# Patient Record
Sex: Female | Born: 1960
Health system: Southern US, Community
[De-identification: ages and names within clinical notes are randomized; demographics above are authoritative.]

## PROBLEM LIST (undated history)

## (undated) DIAGNOSIS — M7552 Bursitis of left shoulder: Secondary | ICD-10-CM

## (undated) DIAGNOSIS — M797 Fibromyalgia: Secondary | ICD-10-CM

## (undated) DIAGNOSIS — H9190 Unspecified hearing loss, unspecified ear: Secondary | ICD-10-CM

## (undated) DIAGNOSIS — M940 Chondrocostal junction syndrome [Tietze]: Secondary | ICD-10-CM

## (undated) DIAGNOSIS — K219 Gastro-esophageal reflux disease without esophagitis: Secondary | ICD-10-CM

## (undated) DIAGNOSIS — L719 Rosacea, unspecified: Secondary | ICD-10-CM

## (undated) DIAGNOSIS — C801 Malignant (primary) neoplasm, unspecified: Secondary | ICD-10-CM

## (undated) DIAGNOSIS — R079 Chest pain, unspecified: Secondary | ICD-10-CM

## (undated) DIAGNOSIS — E559 Vitamin D deficiency, unspecified: Secondary | ICD-10-CM

## (undated) DIAGNOSIS — M1990 Unspecified osteoarthritis, unspecified site: Secondary | ICD-10-CM

## (undated) DIAGNOSIS — K259 Gastric ulcer, unspecified as acute or chronic, without hemorrhage or perforation: Secondary | ICD-10-CM

## (undated) DIAGNOSIS — G43909 Migraine, unspecified, not intractable, without status migrainosus: Secondary | ICD-10-CM

## (undated) DIAGNOSIS — R51 Headache: Secondary | ICD-10-CM

## (undated) HISTORY — DX: Migraine, unspecified, not intractable, without status migrainosus: G43.909

## (undated) HISTORY — DX: Unspecified hearing loss, unspecified ear: H91.90

## (undated) HISTORY — DX: Vitamin D deficiency, unspecified: E55.9

## (undated) HISTORY — DX: Chest pain, unspecified: R07.9

## (undated) HISTORY — PX: DILATION AND CURETTAGE OF UTERUS: SHX78

## (undated) HISTORY — PX: OTHER SURGICAL HISTORY: SHX169

---

## 1995-06-10 HISTORY — PX: OTHER SURGICAL HISTORY: SHX169

## 1998-12-10 ENCOUNTER — Other Ambulatory Visit: Admission: RE | Admit: 1998-12-10 | Discharge: 1998-12-10 | Payer: Self-pay | Admitting: Obstetrics and Gynecology

## 1999-02-04 ENCOUNTER — Other Ambulatory Visit: Admission: RE | Admit: 1999-02-04 | Discharge: 1999-02-04 | Payer: Self-pay | Admitting: Obstetrics and Gynecology

## 1999-02-04 ENCOUNTER — Encounter (INDEPENDENT_AMBULATORY_CARE_PROVIDER_SITE_OTHER): Payer: Self-pay | Admitting: Specialist

## 2001-01-18 ENCOUNTER — Ambulatory Visit (HOSPITAL_COMMUNITY): Admission: RE | Admit: 2001-01-18 | Discharge: 2001-01-18 | Payer: Self-pay | Admitting: Obstetrics and Gynecology

## 2001-01-18 ENCOUNTER — Encounter: Payer: Self-pay | Admitting: Obstetrics and Gynecology

## 2001-04-29 ENCOUNTER — Other Ambulatory Visit: Admission: RE | Admit: 2001-04-29 | Discharge: 2001-04-29 | Payer: Self-pay | Admitting: Obstetrics and Gynecology

## 2001-05-07 ENCOUNTER — Encounter: Payer: Self-pay | Admitting: Obstetrics and Gynecology

## 2001-05-07 ENCOUNTER — Ambulatory Visit (HOSPITAL_COMMUNITY): Admission: RE | Admit: 2001-05-07 | Discharge: 2001-05-07 | Payer: Self-pay | Admitting: Obstetrics and Gynecology

## 2003-12-19 ENCOUNTER — Encounter: Admission: RE | Admit: 2003-12-19 | Discharge: 2003-12-19 | Payer: Self-pay | Admitting: Obstetrics and Gynecology

## 2003-12-26 ENCOUNTER — Other Ambulatory Visit: Admission: RE | Admit: 2003-12-26 | Discharge: 2003-12-26 | Payer: Self-pay | Admitting: Obstetrics and Gynecology

## 2005-03-03 ENCOUNTER — Other Ambulatory Visit: Admission: RE | Admit: 2005-03-03 | Discharge: 2005-03-03 | Payer: Self-pay | Admitting: Obstetrics and Gynecology

## 2005-03-17 ENCOUNTER — Encounter: Admission: RE | Admit: 2005-03-17 | Discharge: 2005-03-17 | Payer: Self-pay | Admitting: Neurology

## 2005-04-11 ENCOUNTER — Encounter: Admission: RE | Admit: 2005-04-11 | Discharge: 2005-04-11 | Payer: Self-pay | Admitting: Obstetrics and Gynecology

## 2005-06-09 HISTORY — PX: CYSTOCELE REPAIR: SHX163

## 2005-06-09 HISTORY — PX: RECTOCELE REPAIR: SHX761

## 2005-12-05 ENCOUNTER — Other Ambulatory Visit: Admission: RE | Admit: 2005-12-05 | Discharge: 2005-12-05 | Payer: Self-pay | Admitting: Obstetrics and Gynecology

## 2006-05-06 ENCOUNTER — Other Ambulatory Visit: Admission: RE | Admit: 2006-05-06 | Discharge: 2006-05-06 | Payer: Self-pay | Admitting: Obstetrics and Gynecology

## 2006-10-13 ENCOUNTER — Other Ambulatory Visit: Admission: RE | Admit: 2006-10-13 | Discharge: 2006-10-13 | Payer: Self-pay | Admitting: Obstetrics and Gynecology

## 2006-10-23 ENCOUNTER — Encounter: Admission: RE | Admit: 2006-10-23 | Discharge: 2006-10-23 | Payer: Self-pay | Admitting: Obstetrics and Gynecology

## 2006-10-29 ENCOUNTER — Encounter: Admission: RE | Admit: 2006-10-29 | Discharge: 2006-10-29 | Payer: Self-pay | Admitting: Sports Medicine

## 2006-10-30 ENCOUNTER — Encounter: Admission: RE | Admit: 2006-10-30 | Discharge: 2006-10-30 | Payer: Self-pay | Admitting: Obstetrics and Gynecology

## 2007-04-21 ENCOUNTER — Other Ambulatory Visit: Admission: RE | Admit: 2007-04-21 | Discharge: 2007-04-21 | Payer: Self-pay | Admitting: Obstetrics and Gynecology

## 2007-06-10 HISTORY — PX: ABDOMINAL HYSTERECTOMY: SHX81

## 2007-09-09 ENCOUNTER — Other Ambulatory Visit: Admission: RE | Admit: 2007-09-09 | Discharge: 2007-09-09 | Payer: Self-pay | Admitting: Obstetrics and Gynecology

## 2007-11-02 ENCOUNTER — Ambulatory Visit (HOSPITAL_COMMUNITY): Admission: RE | Admit: 2007-11-02 | Discharge: 2007-11-02 | Payer: Self-pay | Admitting: Obstetrics and Gynecology

## 2008-11-09 ENCOUNTER — Other Ambulatory Visit: Admission: RE | Admit: 2008-11-09 | Discharge: 2008-11-09 | Payer: Self-pay | Admitting: Obstetrics and Gynecology

## 2008-11-15 ENCOUNTER — Encounter: Admission: RE | Admit: 2008-11-15 | Discharge: 2008-11-15 | Payer: Self-pay | Admitting: Obstetrics and Gynecology

## 2009-12-31 ENCOUNTER — Observation Stay (HOSPITAL_COMMUNITY)
Admission: EM | Admit: 2009-12-31 | Discharge: 2009-12-31 | Payer: Self-pay | Source: Home / Self Care | Admitting: Emergency Medicine

## 2010-01-22 ENCOUNTER — Other Ambulatory Visit: Admission: RE | Admit: 2010-01-22 | Discharge: 2010-01-22 | Payer: Self-pay | Admitting: Obstetrics and Gynecology

## 2010-08-24 LAB — DIFFERENTIAL
Basophils Absolute: 0 10*3/uL (ref 0.0–0.1)
Basophils Relative: 0 % (ref 0–1)
Eosinophils Absolute: 0.3 10*3/uL (ref 0.0–0.7)
Eosinophils Relative: 4 % (ref 0–5)
Monocytes Absolute: 0.5 10*3/uL (ref 0.1–1.0)
Monocytes Relative: 8 % (ref 3–12)
Neutro Abs: 3.8 10*3/uL (ref 1.7–7.7)

## 2010-08-24 LAB — BASIC METABOLIC PANEL
BUN: 7 mg/dL (ref 6–23)
CO2: 27 mEq/L (ref 19–32)
GFR calc non Af Amer: >60 mL/min (ref >60)
Glucose, Bld: 102 mg/dL — ABNORMAL HIGH (ref 70–99)
Potassium: 3.7 mEq/L (ref 3.5–5.1)
Sodium: 139 mEq/L (ref 135–145)

## 2010-08-24 LAB — CBC
HCT: 40.2 % (ref 36.0–46.0)
Hemoglobin: 13.8 g/dL (ref 12.0–15.0)
MCH: 34.4 pg — ABNORMAL HIGH (ref 26.0–34.0)
MCHC: 34.4 g/dL (ref 30.0–36.0)
RDW: 12.4 % (ref 11.5–15.5)

## 2010-11-23 ENCOUNTER — Emergency Department (HOSPITAL_COMMUNITY): Payer: BC Managed Care – PPO

## 2010-11-23 ENCOUNTER — Emergency Department (HOSPITAL_COMMUNITY)
Admission: EM | Admit: 2010-11-23 | Discharge: 2010-11-23 | Disposition: A | Discharge status: Home / Self Care | Payer: BC Managed Care – PPO | Attending: Emergency Medicine | Admitting: Emergency Medicine

## 2010-11-23 DIAGNOSIS — W010XXA Fall on same level from slipping, tripping and stumbling without subsequent striking against object, initial encounter: Secondary | ICD-10-CM | POA: Insufficient documentation

## 2010-11-23 DIAGNOSIS — IMO0002 Reserved for concepts with insufficient information to code with codable children: Secondary | ICD-10-CM | POA: Insufficient documentation

## 2010-11-23 DIAGNOSIS — S8000XA Contusion of unspecified knee, initial encounter: Secondary | ICD-10-CM | POA: Insufficient documentation

## 2010-11-23 DIAGNOSIS — M25539 Pain in unspecified wrist: Secondary | ICD-10-CM | POA: Insufficient documentation

## 2011-02-13 ENCOUNTER — Other Ambulatory Visit: Payer: Self-pay | Admitting: Family Medicine

## 2011-02-13 DIAGNOSIS — Z1231 Encounter for screening mammogram for malignant neoplasm of breast: Secondary | ICD-10-CM

## 2011-03-14 ENCOUNTER — Ambulatory Visit
Admission: RE | Admit: 2011-03-14 | Discharge: 2011-03-14 | Disposition: A | Discharge status: Home / Self Care | Payer: BC Managed Care – PPO | Source: Ambulatory Visit | Attending: Family Medicine | Admitting: Family Medicine

## 2011-03-14 DIAGNOSIS — Z1231 Encounter for screening mammogram for malignant neoplasm of breast: Secondary | ICD-10-CM

## 2011-09-09 ENCOUNTER — Emergency Department (HOSPITAL_COMMUNITY): Payer: BC Managed Care – PPO

## 2011-09-09 ENCOUNTER — Emergency Department (HOSPITAL_COMMUNITY)
Admission: EM | Admit: 2011-09-09 | Discharge: 2011-09-09 | Disposition: A | Discharge status: Home / Self Care | Payer: BC Managed Care – PPO | Attending: Emergency Medicine | Admitting: Emergency Medicine

## 2011-09-09 DIAGNOSIS — S3210XA Unspecified fracture of sacrum, initial encounter for closed fracture: Secondary | ICD-10-CM | POA: Insufficient documentation

## 2011-09-09 DIAGNOSIS — S1093XA Contusion of unspecified part of neck, initial encounter: Secondary | ICD-10-CM | POA: Insufficient documentation

## 2011-09-09 DIAGNOSIS — W19XXXA Unspecified fall, initial encounter: Secondary | ICD-10-CM

## 2011-09-09 DIAGNOSIS — S0003XA Contusion of scalp, initial encounter: Secondary | ICD-10-CM | POA: Insufficient documentation

## 2011-09-09 DIAGNOSIS — Z79899 Other long term (current) drug therapy: Secondary | ICD-10-CM | POA: Insufficient documentation

## 2011-09-09 DIAGNOSIS — R269 Unspecified abnormalities of gait and mobility: Secondary | ICD-10-CM | POA: Insufficient documentation

## 2011-09-09 DIAGNOSIS — R51 Headache: Secondary | ICD-10-CM | POA: Insufficient documentation

## 2011-09-09 DIAGNOSIS — M533 Sacrococcygeal disorders, not elsewhere classified: Secondary | ICD-10-CM | POA: Insufficient documentation

## 2011-09-09 DIAGNOSIS — W1809XA Striking against other object with subsequent fall, initial encounter: Secondary | ICD-10-CM | POA: Insufficient documentation

## 2011-09-09 MED ORDER — IBUPROFEN 200 MG PO TABS
600.0000 mg | ORAL_TABLET | Freq: Once | ORAL | Status: AC
  Administered 2011-09-09: 600 mg via ORAL
  Filled 2011-09-09: qty 3

## 2011-09-09 MED ORDER — OXYCODONE-ACETAMINOPHEN 5-325 MG PO TABS
1.0000 | ORAL_TABLET | Freq: Once | ORAL | Status: AC
  Administered 2011-09-09: 1 via ORAL
  Filled 2011-09-09: qty 1

## 2011-09-09 MED ORDER — OXYCODONE-ACETAMINOPHEN 5-325 MG PO TABS: 1.0000 | ORAL_TABLET | Freq: Four times a day (QID) | ORAL | Status: AC | PRN

## 2011-09-09 MED ORDER — NAPROXEN 375 MG PO TABS: 375.0000 mg | ORAL_TABLET | Freq: Two times a day (BID) | ORAL | Status: DC

## 2011-09-09 MED ORDER — DIAZEPAM 5 MG PO TABS
5.0000 mg | ORAL_TABLET | Freq: Once | ORAL | Status: AC
  Administered 2011-09-09: 5 mg via ORAL
  Filled 2011-09-09: qty 1

## 2011-09-09 MED ORDER — DIAZEPAM 5 MG PO TABS: 5.0000 mg | ORAL_TABLET | Freq: Three times a day (TID) | ORAL | Status: AC | PRN

## 2011-09-09 MED ORDER — PROMETHAZINE HCL 25 MG PO TABS: 25.0000 mg | ORAL_TABLET | Freq: Four times a day (QID) | ORAL | Status: DC | PRN

## 2011-09-09 NOTE — ED Notes (Signed)
States is not able to sit up with out falling backward. Balance feels off -- per patient

## 2011-09-09 NOTE — ED Provider Notes (Signed)
Medical screening examination/treatment/procedure(s) were performed by non-physician practitioner and as supervising physician I was immediately available for consultation/collaboration.   Gavin Pound. Oletta Lamas, MD 09/09/11 1610

## 2011-09-09 NOTE — ED Notes (Signed)
[id number]<YQ> Expected date:09/09/11<BR> Expected time: 4:39 PM<BR> Means of arrival:<BR> Comments:<BR>

## 2011-09-09 NOTE — ED Notes (Signed)
Pt was able to ambulate with minimal assistance from restroom back to bed. Pt states she feels less dizzy now and has her balance back

## 2011-09-09 NOTE — ED Provider Notes (Signed)
History     CSN: [id number]  Arrival date & time 09/09/11  1637   First MD Initiated Contact with Patient 09/09/11 1720      Chief Complaint  Patient presents with  . Tailbone Pain  . Neck Pain  . Fall    hit head on garage door, fell backward    (Consider location/radiation/quality/duration/timing/severity/associated sxs/prior treatment) HPI Comments: Patient presents emergency department via EMS with a chief complaint of fall.  Mechanism was that patient was running as carotid or with opening when she hit her head on the door and fell back.  Incident occurred a couple of hours ago.  The point of impact of fall was back of head and tail bone.  Patient states that these 2 areas are hurting, pain does not radiate, and patient rates her pain at a 7/10 in severity. Patient denies any neck pain, change in vision, nausea, vomiting, severe headache, weakness, saddle paresthesias, loss control of bowel or bladder.  Patient has no other complaints at this time.  Patient is a 51 y.o. female presenting with fall. The history is provided by the patient.  Fall Pertinent negatives include no fever, no numbness, no abdominal pain and no headaches.    No past medical history on file.  No past surgical history on file.  No family history on file.  History  Substance Use Topics  . Smoking status: Not on file  . Smokeless tobacco: Not on file  . Alcohol Use: Not on file    OB History    No data available      Review of Systems  Constitutional: Negative for fever, chills and appetite change.  HENT: Negative for congestion.   Eyes: Negative for visual disturbance.  Respiratory: Negative for shortness of breath.   Cardiovascular: Negative for chest pain and leg swelling.  Gastrointestinal: Negative for abdominal pain.  Genitourinary: Negative for dysuria, urgency and frequency.  Musculoskeletal: Positive for gait problem.       Tailbone pain   Neurological: Negative for dizziness,  syncope, weakness, light-headedness, numbness and headaches.  Psychiatric/Behavioral: Negative for confusion.  All other systems reviewed and are negative.    Allergies  Sulfa antibiotics  Home Medications   Current Outpatient Rx  Name Route Sig Dispense Refill  . GABAPENTIN 600 MG PO TABS Oral Take 600 mg by mouth 3 (three) times daily.    . IBUPROFEN 200 MG PO TABS Oral Take 800 mg by mouth every 6 (six) hours as needed.      BP 127/80  Pulse 78  Temp(Src) 98.5 F (36.9 C) (Oral)  Resp 16  SpO2 100%  Physical Exam  Nursing note and vitals reviewed. Constitutional: She is oriented to person, place, and time. She appears well-developed and well-nourished. No distress.  HENT:  Head: Normocephalic.         No ttp of orbits. No battle sign or racoon sign.   Eyes: Conjunctivae and EOM are normal. Pupils are equal, round, and reactive to light. No scleral icterus.  Neck: Normal range of motion and full passive range of motion without pain. Neck supple. No JVD present. Carotid bruit is not present. No rigidity. No Brudzinski's sign noted.  Cardiovascular: Normal rate, regular rhythm, normal heart sounds and intact distal pulses.   Pulmonary/Chest: Effort normal and breath sounds normal. No respiratory distress. She has no wheezes. She has no rales.  Musculoskeletal: Normal range of motion.       Tenderness to palpation over her tailbone, no tenderness  or step off of cervical, thoracic, or lumbar spine.  No hip instability.  No pain with range of motion exercises or internal/external rotation of hips bilaterally.  Lymphadenopathy:    She has no cervical adenopathy.  Neurological: She is alert and oriented to person, place, and time. She has normal strength. No cranial nerve deficit or sensory deficit. She displays a negative Romberg sign. Coordination and gait normal. GCS eye subscore is 4. GCS verbal subscore is 5. GCS motor subscore is 6.       A&O x3.  Able to follow commands.  PERRL, EOMs, no vertical or bidirectional nystagmus. Shoulder shrug, facial muscles, tongue protrusion and swallow intact.  Motor strength 5/5 bilaterally including grip strength, triceps, hamstrings and ankle dorsiflexion.  Light touch intact in all 4 distal limbs.  Intact finger to nose, shin to heel and rapid alternating movements. Pt able to ambulate, no dysequilibrium   Skin: Skin is warm and dry. No rash noted. She is not diaphoretic.  Psychiatric: She has a normal mood and affect. Her behavior is normal.    ED Course  Procedures (including critical care time)  Labs Reviewed - No data to display Dg Cervical Spine Complete  09/09/2011  *RADIOLOGY REPORT*  Clinical Data: Fall.  Posterior neck pain.  CERVICAL SPINE - COMPLETE 4+ VIEW  Comparison: None.  Findings: Upper lung zone pulmonary vascular indistinctness noted. The tip of the odontoid is obscured by the skull base on the open mouth odontoid view  The upper cervical spine is obscured on the oblique views due to hyperextension of the neck.  No prevertebral soft tissue swelling or malalignment is observed. No cervical spine fracture is seen.  IMPRESSION:  1.  No cervical spine fracture or static instability is demonstrated.  Mildly reduced characterization of the upper cervical spine due to difficulty with positioning the patient's head.  Original Report Authenticated By: Dellia Cloud, M.D.   Dg Sacrum/coccyx  09/09/2011  *RADIOLOGY REPORT*  Clinical Data: Fall, with pain in sacrococcygeal region.  SACRUM AND COCCYX - 2+ VIEW  Comparison: None.  Findings: No disruption of the arcuate lines of the sacrum noted. No obvious sacroiliac joint widening.  Sacrococcygeal margins are indistinct on the frontal projections. On the lateral projection, there is a focal anterior concavity of the sacrum suspicious for nondisplaced sacral fracture at the S3 level, and also irregularity in the lower sacrum and approximately the S5 level, suspicious for  fracture.  IMPRESSION:  1.  Suspicion for fracture at the S3 and S5 levels.  This could be confirmed with cross-sectional imaging if clinically warranted.  Original Report Authenticated By: Dellia Cloud, M.D.   Ct Head Wo Contrast  09/09/2011  *RADIOLOGY REPORT*  Clinical Data: Fall.  Head injury.  CT HEAD WITHOUT CONTRAST  Technique:  Contiguous axial images were obtained from the base of the skull through the vertex without contrast.  Comparison: 03/17/2005  Findings: The brain stem, cerebellum, cerebral peduncles, thalami, basal ganglia, basilar cisterns, and ventricular system appear unremarkable.  No intracranial hemorrhage, mass lesion, or acute infarction is identified.  Posterior scalp hematoma noted.  Air-fluid level in the left maxillary sinus is probably from acute sinusitis.  IMPRESSION:  1.  Acute left maxillary sinusitis. 2.  The posterior scalp hematoma. 3.  No acute intracranial findings.  Original Report Authenticated By: Dellia Cloud, M.D.     No diagnosis found.  The patient denies any neck pain. There is no tenderness on palpation of the cervical spine  and no step-offs. The patient can look to the left and right voluntarily without pain and flex and extend the neck without pain. Cervical collar cleared.   MDM  Fall, fracture S3/S5  Patient is to followup with [facility] orth[facility] next week in regards to possible sacral fractures found on x-ray. Non concerning for cauda equina. C-Spine cleared. Patient will be given symptomatic treatment including Valium, Percocet, and ibuprofen.  Advised patient to get a inflatable doughnut to help with pain.  Discuss postconcussive syndrome and thoroughly as likely cause of patient's dizziness with ambulation.  Patient has no focal neuro deficits on exam        Jaci Carrel, Cordelia Poche 09/09/11 1903

## 2011-09-09 NOTE — Discharge Instructions (Signed)
TREATMENT  Use your pain medications as prescribed. Most fractures in this area heal on their own in 4 to 6 weeks.  HOME CARE INSTRUCTIONS   Put ice on the injured area.   Put ice in a plastic bag.   Place a towel between your skin and the bag.   Leave the ice on for 15 to 20 minutes, every hour while awake for the first 1 to 2 days.   Sit on a large, rubber or inflated ring or cushion to ease your pain. Lean forward when sitting to help decrease discomfort.   Avoid sitting for long periods of time.   Increase your activity as the pain allows.   Only take over-the-counter or prescription medicines for pain, discomfort, or fever as directed by your caregiver.   You may use stool softeners if it is painful to have a bowel movement, or as directed by your caregiver.   Eat a diet with plenty of fiber to help prevent constipation.   Keep all follow-up appointments as directed by your caregiver.  SEEK MEDICAL CARE IF:   Your pain becomes worse.   Your bowel movements cause a great deal of discomfort.   You are unable to have a bowel movement.   You have a fever.  MAKE SURE YOU:  Understand these instructions.   Will watch your condition.   Will get help right away if you are not doing well or get worse.  Document Released: 05/23/2000 Document Revised: 05/15/2011 Document Reviewed: 12/19/2010 [facility] Patient Information 2012 [place], [place].

## 2012-03-23 ENCOUNTER — Emergency Department (HOSPITAL_COMMUNITY): Payer: BC Managed Care – PPO

## 2012-03-23 ENCOUNTER — Inpatient Hospital Stay (HOSPITAL_COMMUNITY)
Admission: EM | Admit: 2012-03-23 | Discharge: 2012-03-25 | DRG: 174 | Disposition: A | Discharge status: Home / Self Care | Payer: BC Managed Care – PPO | Attending: Internal Medicine | Admitting: Internal Medicine

## 2012-03-23 ENCOUNTER — Encounter (HOSPITAL_COMMUNITY): Payer: Self-pay | Admitting: *Deleted

## 2012-03-23 DIAGNOSIS — T3995XA Adverse effect of unspecified nonopioid analgesic, antipyretic and antirheumatic, initial encounter: Secondary | ICD-10-CM | POA: Diagnosis present

## 2012-03-23 DIAGNOSIS — IMO0001 Reserved for inherently not codable concepts without codable children: Secondary | ICD-10-CM

## 2012-03-23 DIAGNOSIS — K209 Esophagitis, unspecified without bleeding: Secondary | ICD-10-CM

## 2012-03-23 DIAGNOSIS — D62 Acute posthemorrhagic anemia: Secondary | ICD-10-CM | POA: Diagnosis present

## 2012-03-23 DIAGNOSIS — D649 Anemia, unspecified: Secondary | ICD-10-CM

## 2012-03-23 DIAGNOSIS — K3189 Other diseases of stomach and duodenum: Principal | ICD-10-CM | POA: Diagnosis present

## 2012-03-23 DIAGNOSIS — G43909 Migraine, unspecified, not intractable, without status migrainosus: Secondary | ICD-10-CM

## 2012-03-23 DIAGNOSIS — K922 Gastrointestinal hemorrhage, unspecified: Secondary | ICD-10-CM | POA: Diagnosis present

## 2012-03-23 DIAGNOSIS — K259 Gastric ulcer, unspecified as acute or chronic, without hemorrhage or perforation: Secondary | ICD-10-CM

## 2012-03-23 DIAGNOSIS — K2100 Gastro-esophageal reflux disease with esophagitis, without bleeding: Secondary | ICD-10-CM | POA: Diagnosis present

## 2012-03-23 DIAGNOSIS — K254 Chronic or unspecified gastric ulcer with hemorrhage: Principal | ICD-10-CM | POA: Diagnosis present

## 2012-03-23 DIAGNOSIS — M797 Fibromyalgia: Secondary | ICD-10-CM

## 2012-03-23 DIAGNOSIS — E876 Hypokalemia: Secondary | ICD-10-CM | POA: Diagnosis present

## 2012-03-23 DIAGNOSIS — Y92009 Unspecified place in unspecified non-institutional (private) residence as the place of occurrence of the external cause: Secondary | ICD-10-CM

## 2012-03-23 DIAGNOSIS — I951 Orthostatic hypotension: Secondary | ICD-10-CM

## 2012-03-23 HISTORY — DX: Headache: R51

## 2012-03-23 HISTORY — DX: Unspecified osteoarthritis, unspecified site: M19.90

## 2012-03-23 HISTORY — DX: Gastro-esophageal reflux disease without esophagitis: K21.9

## 2012-03-23 HISTORY — DX: Bursitis of left shoulder: M75.52

## 2012-03-23 HISTORY — DX: Rosacea, unspecified: L71.9

## 2012-03-23 HISTORY — DX: Fibromyalgia: M79.7

## 2012-03-23 LAB — CBC WITH DIFFERENTIAL/PLATELET
Basophils Absolute: 0 10*3/uL (ref 0.0–0.1)
Basophils Relative: 0 % (ref 0–1)
Eosinophils Absolute: 0 10*3/uL (ref 0.0–0.7)
Eosinophils Relative: 0 % (ref 0–5)
MCH: 32.9 pg (ref 26.0–34.0)
MCHC: 34.8 g/dL (ref 30.0–36.0)
MCV: 94.7 fL (ref 78.0–100.0)
Neutrophils Relative %: 88 % — ABNORMAL HIGH (ref 43–77)
Platelets: 368 10*3/uL (ref 150–400)
RBC: 3.19 MIL/uL — ABNORMAL LOW (ref 3.87–5.11)
RDW: 12 % (ref 11.5–15.5)

## 2012-03-23 LAB — COMPREHENSIVE METABOLIC PANEL
ALT: 12 U/L (ref 0–35)
Albumin: 3.4 g/dL — ABNORMAL LOW (ref 3.5–5.2)
Alkaline Phosphatase: 44 U/L (ref 39–117)
Calcium: 8.8 mg/dL (ref 8.4–10.5)
GFR calc Af Amer: >90 mL/min (ref >90)
Potassium: 3.4 mEq/L — ABNORMAL LOW (ref 3.5–5.1)
Sodium: 135 mEq/L (ref 135–145)
Total Protein: 6.2 g/dL (ref 6.0–8.3)

## 2012-03-23 LAB — URINALYSIS, ROUTINE W REFLEX MICROSCOPIC
Bilirubin Urine: NEGATIVE
Nitrite: NEGATIVE
Specific Gravity, Urine: 1.023 (ref 1.005–1.030)
Urobilinogen, UA: 0.2 mg/dL (ref 0.0–1.0)
pH: 5.5 (ref 5.0–8.0)

## 2012-03-23 LAB — PREPARE RBC (CROSSMATCH)

## 2012-03-23 LAB — APTT: aPTT: 27 seconds (ref 24–37)

## 2012-03-23 LAB — PROTIME-INR
INR: 1.08 (ref 0.00–1.49)
Prothrombin Time: 13.9 seconds (ref 11.6–15.2)

## 2012-03-23 LAB — URINE MICROSCOPIC-ADD ON

## 2012-03-23 LAB — LIPASE, BLOOD: Lipase: 31 U/L (ref 11–59)

## 2012-03-23 MED ORDER — POTASSIUM CHLORIDE IN NACL 40-0.9 MEQ/L-% IV SOLN
INTRAVENOUS | Status: DC
  Administered 2012-03-24: 02:00:00 via INTRAVENOUS
  Administered 2012-03-24: 100 mL/h via INTRAVENOUS
  Filled 2012-03-23 (×4): qty 1000

## 2012-03-23 MED ORDER — SODIUM CHLORIDE 0.9 % IV SOLN: 250.0000 mL | INTRAVENOUS | Status: DC | PRN

## 2012-03-23 MED ORDER — SODIUM CHLORIDE 0.9 % IV BOLUS (SEPSIS)
1000.0000 mL | Freq: Once | INTRAVENOUS | Status: AC
  Administered 2012-03-23: 1000 mL via INTRAVENOUS

## 2012-03-23 MED ORDER — SODIUM CHLORIDE 0.9 % IJ SOLN: 3.0000 mL | Freq: Two times a day (BID) | INTRAMUSCULAR | Status: DC

## 2012-03-23 MED ORDER — ONDANSETRON HCL 4 MG PO TABS
4.0000 mg | ORAL_TABLET | Freq: Four times a day (QID) | ORAL | Status: DC | PRN
  Administered 2012-03-25: 4 mg via ORAL
  Filled 2012-03-23: qty 1

## 2012-03-23 MED ORDER — SODIUM CHLORIDE 0.9 % IV SOLN
8.0000 mg/h | INTRAVENOUS | Status: DC
  Administered 2012-03-23: 8 mg/h via INTRAVENOUS
  Filled 2012-03-23 (×2): qty 80

## 2012-03-23 MED ORDER — ONDANSETRON HCL 4 MG/2ML IJ SOLN: 4.0000 mg | Freq: Four times a day (QID) | INTRAMUSCULAR | Status: DC | PRN

## 2012-03-23 MED ORDER — MORPHINE SULFATE 2 MG/ML IJ SOLN: 2.0000 mg | INTRAMUSCULAR | Status: DC | PRN

## 2012-03-23 MED ORDER — SODIUM CHLORIDE 0.9 % IJ SOLN: 3.0000 mL | INTRAMUSCULAR | Status: DC | PRN

## 2012-03-23 MED ORDER — SODIUM CHLORIDE 0.9 % IV SOLN
80.0000 mg | Freq: Once | INTRAVENOUS | Status: AC
  Administered 2012-03-23: 80 mg via INTRAVENOUS
  Filled 2012-03-23: qty 80

## 2012-03-23 NOTE — ED Notes (Signed)
NWG:[id number]> Expected date:03/23/12<BR> Expected time: 5:49 PM<BR> Means of arrival:<BR> Comments:<BR> N/V-weak

## 2012-03-23 NOTE — H&P (Signed)
PCP:   Gaye Alken, MD   Chief Complaint:  Abdominal pain/Coffee ground emesis.  HPI: This is a 51 year old female, with known history of Rosacea, GERD, Fibromyalgia, migraines, s/p Hysterectomy, s/p I&D of left index finger whitlow 12/2909, pelvic fracture, following fall in spring 2013, left shoulder bursitis, presenting with above symptoms. According to patient, she has been troubled by migraines headaches since 03/20/12, and resorted to taking Ibuprofen, Naproxen and flexeril. At about 7:30 PM on 03/22/12, she developed upper abdominal pain, which has remained persistent, and had a very restless night, as she could  Not get comfortable. At about 10:30 AM on 03/22/12, she developed nausea, and vomited bright red and dark blood about 3 times in the morning, felt weak and dizzy all day, and in the evening at about 5:30 PM, vomited dark blood again, on toilet floor. Her daughter called 911. In the ED, BP has been borderline low, ranging from 83/55-103/61, associated with orthostasis. She has had no dark stools. FOBT was negative.   Allergies:   Allergies  Allergen Reactions  . Sulfa Antibiotics Hives    dizzness      Past Medical History  Diagnosis Date  . Fibromyalgia     Past Surgical History  Procedure Date  . Abdominal hysterectomy     Prior to Admission medications   Medication Sig Start Date End Date Taking? Authorizing Provider  b complex vitamins tablet Take 1 tablet by mouth daily.   Yes Historical Provider, MD  cholecalciferol (VITAMIN D) 1000 UNITS tablet Take 1,000 Units by mouth daily.   Yes Historical Provider, MD  cyclobenzaprine (FLEXERIL) 10 MG tablet Take 10 mg by mouth 3 (three) times daily as needed. Muscle spasms   Yes Historical Provider, MD  gabapentin (NEURONTIN) 600 MG tablet Take 600 mg by mouth 3 (three) times daily.   Yes Historical Provider, MD  ibuprofen (ADVIL,MOTRIN) 200 MG tablet Take 800 mg by mouth every 6 (six) hours as needed. pain    Yes Historical Provider, MD  Multiple Vitamin (MULTIVITAMIN WITH MINERALS) TABS Take 1 tablet by mouth daily.   Yes Historical Provider, MD  naproxen (NAPROSYN) 375 MG tablet Take 1 tablet (375 mg total) by mouth 2 (two) times daily. 09/09/11 09/08/12 Yes Lisette Paz, PA-C    Social History: Patient is married, is a homemaker, has 3 offspring and does not have a smoking history on file. She does not have any smokeless tobacco history on file. Her alcohol and drug histories not on file.  Family History: Father died at age 34 years, s/p MI. He was hypertensive, and his siblings had heart problems. Mother died at age 63 years, from vascular dementia. Her siblings have a history of HTN.   Review of Systems:  As per HPI and chief complaint. Patent feels fatigued, has not eaten since last night, denies weight loss, fever, chills, headache, blurred vision, difficulty in speaking, dysphagia, chest pain, cough, shortness of breath, orthopnea, paroxysmal nocturnal dyspnea, nausea, diaphoresis, diarrhea, belching, heartburn, melena, dysuria, nocturia, urinary frequency, hematochezia, lower extremity swelling, pain, or redness. The rest of the systems review is negative.  Physical Exam:  General:  Patient does not appear to be in obvious acute distress. Alert, communicative, fully oriented, talking in complete sentences, not short of breath at rest.  HEENT:  Mild clinical pallor, no jaundice, no conjunctival injection or discharge. Hydration appears fair.  NECK:  Supple, JVP not seen, no carotid bruits, no palpable lymphadenopathy, no palpable goiter. CHEST:  Clinically clear  to auscultation, no wheezes, no crackles. HEART:  Sounds 1 and 2 heard, normal, regular, no murmurs. ABDOMEN:  Full, soft, non-tender, no palpable organomegaly, no palpable masses, normal bowel sounds. GENITALIA:  Not examined. LOWER EXTREMITIES:  No pitting edema, palpable peripheral pulses. MUSCULOSKELETAL SYSTEM:   Unremarkable. CENTRAL NERVOUS SYSTEM:  No focal neurologic deficit on gross examination.  Labs on Admission:  Results for orders placed during the hospital encounter of 03/23/12 (from the past 48 hour(s))  CBC WITH DIFFERENTIAL     Status: Abnormal   Collection Time   03/23/12  8:10 PM      Component Value Range Comment   WBC 11.0 (*) 4.0 - 10.5 K/uL    RBC 3.19 (*) 3.87 - 5.11 MIL/uL    Hemoglobin 10.5 (*) 12.0 - 15.0 g/dL    HCT 19.1 (*) 47.8 - 46.0 %    MCV 94.7  78.0 - 100.0 fL    MCH 32.9  26.0 - 34.0 pg    MCHC 34.8  30.0 - 36.0 g/dL    RDW 29.5  62.1 - 30.8 %    Platelets 368  150 - 400 K/uL    Neutrophils Relative 88 (*) 43 - 77 %    Neutro Abs 9.7 (*) 1.7 - 7.7 K/uL    Lymphocytes Relative 7 (*) 12 - 46 %    Lymphs Abs 0.8  0.7 - 4.0 K/uL    Monocytes Relative 4  3 - 12 %    Monocytes Absolute 0.5  0.1 - 1.0 K/uL    Eosinophils Relative 0  0 - 5 %    Eosinophils Absolute 0.0  0.0 - 0.7 K/uL    Basophils Relative 0  0 - 1 %    Basophils Absolute 0.0  0.0 - 0.1 K/uL   COMPREHENSIVE METABOLIC PANEL     Status: Abnormal   Collection Time   03/23/12  8:10 PM      Component Value Range Comment   Sodium 135  135 - 145 mEq/L    Potassium 3.4 (*) 3.5 - 5.1 mEq/L    Chloride 102  96 - 112 mEq/L    CO2 24  19 - 32 mEq/L    Glucose, Bld 123 (*) 70 - 99 mg/dL    BUN 44 (*) 6 - 23 mg/dL    Creatinine, Ser 6.57  0.50 - 1.10 mg/dL    Calcium 8.8  8.4 - 84.6 mg/dL    Total Protein 6.2  6.0 - 8.3 g/dL    Albumin 3.4 (*) 3.5 - 5.2 g/dL    AST 14  0 - 37 U/L    ALT 12  0 - 35 U/L    Alkaline Phosphatase 44  39 - 117 U/L    Total Bilirubin 0.3  0.3 - 1.2 mg/dL    GFR calc non Af Amer >90  >90 mL/min    GFR calc Af Amer >90  >90 mL/min   LIPASE, BLOOD     Status: Normal   Collection Time   03/23/12  8:10 PM      Component Value Range Comment   Lipase 31  11 - 59 U/L   PROTIME-INR     Status: Normal   Collection Time   03/23/12  8:10 PM      Component Value Range Comment    Prothrombin Time 13.9  11.6 - 15.2 seconds    INR 1.08  0.00 - 1.49   APTT     Status: Normal  Collection Time   03/23/12  8:10 PM      Component Value Range Comment   aPTT 27  24 - 37 seconds   TYPE AND SCREEN     Status: Normal (Preliminary result)   Collection Time   03/23/12  8:10 PM      Component Value Range Comment   ABO/RH(D) B POS      Antibody Screen NEG      Sample Expiration 03/26/2012      Unit Number [id number]      Blood Component Type RED CELLS,LR      Unit division 00      Status of Unit ALLOCATED      Transfusion Status OK TO TRANSFUSE      Crossmatch Result Compatible     ABO/RH     Status: Normal   Collection Time   03/23/12 10:30 PM      Component Value Range Comment   ABO/RH(D) B POS     PREPARE RBC (CROSSMATCH)     Status: Normal   Collection Time   03/23/12 10:30 PM      Component Value Range Comment   Order Confirmation ORDER PROCESSED BY BLOOD BANK       Radiological Exams on Admission: *RADIOLOGY REPORT*  Clinical Data: Nausea, vomiting and weakness.  PORTABLE CHEST - 1 VIEW  Comparison: None  Findings: The cardiomediastinal silhouette is unremarkable. The lungs are clear. There is no evidence of focal airspace disease, pulmonary edema, suspicious pulmonary nodule/mass, pleural effusion, or pneumothorax. No acute bony abnormalities are identified.  IMPRESSION: No evidence of active cardiopulmonary disease.   Original Report Authenticated By: Rosendo Gros, M.D.    Assessment/Plan Active Problems:  1. GI bleed: Patient present ed with 2 days of abdominal pain followed by hematemesis, against a known background of GERD and NSAID use. Clinically, she has acute upper GI bleed, and differentials include NSAID-induced bleeding peptic ulcer, erosive gastritis/esophagitis, although other lesions may be possible. Although FOBT is negative, BUN is elevated, consistent with GI blood loss. She will be admitted to the unit as she has  evidence of hemodynamic instability, and managed with bowel rest, iv PPI infusion, and antiemetics. Dr Lina Sar, GI has been consulted, for endoscopic evaluation.  2. Anemia: Hemoglobin was 10.5 at presentation. Last document hemoglobin in EMR, was 13.8 on 12/31/09. This is likely due to acute blood loss. Patient was hemodynamically unstable on arrival in the ED, with orthostasis, tachycardia, postural dizziness  And low BP. 2 units of PRBC will be transfused immediately, patient has already been bolused with iv fluids, and serial CBCs will be followed. We shall transfuse prn.  3. Migraines: Asymptomatic at this time.  4. Fibromyalgia: Not problematic.  5. Hypokalemia: Repleting as indicated.   Further management will depend on clinical course.   Comment: Patient is FULL CODE.   Time Spent on Admission: 40 mins.   Cristina Sanchez,CHRISTOPHER 03/23/2012, 11:16 PM

## 2012-03-23 NOTE — ED Notes (Signed)
Pt reports feeling dizzy when pt sits up.

## 2012-03-23 NOTE — ED Provider Notes (Signed)
History     CSN: [id number]  Arrival date & time 03/23/12  1801   First MD Initiated Contact with Patient 03/23/12 1910      Chief Complaint  Patient presents with  . Abdominal Pain  . Nausea  . Hematemesis    Coffee ground     (Consider location/radiation/quality/duration/timing/severity/associated sxs/prior treatment) HPI Pt with several days of migraine HA being treated with NSAID's began having upper abdominal pain last night and then had multiple grossly bloody bouts of emesis today. Pt states she get lightheaded with standing. No change in stool color. States pain has improved. Previous hx of GERD.  Past Medical History  Diagnosis Date  . Fibromyalgia     Past Surgical History  Procedure Date  . Abdominal hysterectomy     History reviewed. No pertinent family history.  History  Substance Use Topics  . Smoking status: Not on file  . Smokeless tobacco: Not on file  . Alcohol Use:     OB History    Grav Para Term Preterm Abortions TAB SAB Ect Mult Living                  Review of Systems  Constitutional: Negative for fever and chills.  Respiratory: Negative for chest tightness, shortness of breath and wheezing.   Cardiovascular: Negative for chest pain.  Gastrointestinal: Positive for nausea, vomiting and abdominal pain. Negative for diarrhea and constipation.  Genitourinary: Negative for dysuria.  Musculoskeletal: Negative for back pain.  Skin: Negative for rash and wound.  Neurological: Positive for dizziness, light-headedness and headaches. Negative for weakness and numbness.    Allergies  Sulfa antibiotics  Home Medications   Current Outpatient Rx  Name Route Sig Dispense Refill  . B COMPLEX PO TABS Oral Take 1 tablet by mouth daily.    Marland Kitchen VITAMIN D 1000 UNITS PO TABS Oral Take 1,000 Units by mouth daily.    . CYCLOBENZAPRINE HCL 10 MG PO TABS Oral Take 10 mg by mouth 3 (three) times daily as needed. Muscle spasms    . GABAPENTIN 600 MG PO  TABS Oral Take 600 mg by mouth 3 (three) times daily.    . IBUPROFEN 200 MG PO TABS Oral Take 800 mg by mouth every 6 (six) hours as needed. pain    . ADULT MULTIVITAMIN W/MINERALS CH Oral Take 1 tablet by mouth daily.    Marland Kitchen NAPROXEN 375 MG PO TABS Oral Take 1 tablet (375 mg total) by mouth 2 (two) times daily. 20 tablet 0    BP 118/67  Pulse 98  Temp 98 F (36.7 C) (Oral)  Resp 21  SpO2 100%  Physical Exam  Nursing note and vitals reviewed. Constitutional: She is oriented to person, place, and time. She appears well-developed and well-nourished. No distress.  HENT:  Head: Normocephalic and atraumatic.  Mouth/Throat: Oropharynx is clear and moist.  Eyes: EOM are normal. Pupils are equal, round, and reactive to light.  Neck: Normal range of motion. Neck supple.  Cardiovascular: Normal rate and regular rhythm.   Pulmonary/Chest: Effort normal and breath sounds normal. No respiratory distress. She has no wheezes. She has no rales.  Abdominal: Soft. Bowel sounds are normal. She exhibits no distension and no mass. There is tenderness (mild epigastric TTP). There is no rebound and no guarding.  Musculoskeletal: Normal range of motion. She exhibits no edema and no tenderness.  Neurological: She is alert and oriented to person, place, and time.       5/5 motor,  sensation intact  Skin: Skin is warm and dry. No rash noted. No erythema.  Psychiatric: She has a normal mood and affect. Her behavior is normal.    ED Course  Procedures (including critical care time)  Labs Reviewed  CBC WITH DIFFERENTIAL - Abnormal; Notable for the following:    WBC 11.0 (*)     RBC 3.19 (*)     Hemoglobin 10.5 (*)     HCT 30.2 (*)     Neutrophils Relative 88 (*)     Neutro Abs 9.7 (*)     Lymphocytes Relative 7 (*)     All other components within normal limits  COMPREHENSIVE METABOLIC PANEL - Abnormal; Notable for the following:    Potassium 3.4 (*)     Glucose, Bld 123 (*)     BUN 44 (*)     Albumin  3.4 (*)     All other components within normal limits  LIPASE, BLOOD  PROTIME-INR  APTT  TYPE AND SCREEN  ABO/RH  PREPARE RBC (CROSSMATCH)  URINALYSIS, ROUTINE W REFLEX MICROSCOPIC  OCCULT BLOOD X 1 CARD TO LAB, STOOL   Dg Chest Port 1 View  03/23/2012  *RADIOLOGY REPORT*  Clinical Data: Nausea, vomiting and weakness.  PORTABLE CHEST - 1 VIEW  Comparison: None  Findings: The cardiomediastinal silhouette is unremarkable. The lungs are clear. There is no evidence of focal airspace disease, pulmonary edema, suspicious pulmonary nodule/mass, pleural effusion, or pneumothorax. No acute bony abnormalities are identified.  IMPRESSION: No evidence of active cardiopulmonary disease.   Original Report Authenticated By: Rosendo Gros, M.D.      1. Upper GI bleed   2. Orthostasis      Date: 03/23/2012  Rate:112  Rhythm: sinus tachycardia  QRS Axis: normal  Intervals: normal  ST/T Wave abnormalities: normal  Conduction Disutrbances:none  Narrative Interpretation:   Old EKG Reviewed: none available    MDM  Pt feeling better with IVF. Stool guaiac negative. Discussed with triad who will admit to stepdown bed.         Loren Racer, MD 03/23/12 2303

## 2012-03-23 NOTE — ED Notes (Signed)
MD at bedside.  Blood transfusion consent obtained.

## 2012-03-23 NOTE — ED Notes (Signed)
Per EMS, pt from home with reports of abdominal pain that started yesterday followed by 3 episodes of coffee ground emesis. Pt denies hx of GI bleed. EMS started IV en route but no meds given.

## 2012-03-23 NOTE — Progress Notes (Signed)
51 yo female with an acute UGI bleed, hemodynamically unstable on arrival but currently BP > 100 sys, Hgb 10.3, suspect NSAID related acute bleed, including a Mallory-Weiss tear. . Will plan EGD in am or earlier if BP <95, or Pulse>110. Discussed with Dr Brien Few .I have notified the Endoscopy unit  .Would keep 2 units ahead of the blood transfusions. Dr Christella Hartigan is covering the hospital.Requested  Procedure permit.  Lina Sar MD,pager [phone]

## 2012-03-24 ENCOUNTER — Encounter (HOSPITAL_COMMUNITY)
Admission: EM | Disposition: A | Discharge status: Home / Self Care | Payer: Self-pay | Source: Home / Self Care | Attending: Internal Medicine

## 2012-03-24 ENCOUNTER — Encounter (HOSPITAL_COMMUNITY): Payer: Self-pay

## 2012-03-24 DIAGNOSIS — K209 Esophagitis, unspecified without bleeding: Secondary | ICD-10-CM

## 2012-03-24 DIAGNOSIS — K922 Gastrointestinal hemorrhage, unspecified: Secondary | ICD-10-CM

## 2012-03-24 DIAGNOSIS — K259 Gastric ulcer, unspecified as acute or chronic, without hemorrhage or perforation: Secondary | ICD-10-CM

## 2012-03-24 HISTORY — PX: ESOPHAGOGASTRODUODENOSCOPY: SHX5428

## 2012-03-24 LAB — CBC
HCT: 24.1 % — ABNORMAL LOW (ref 36.0–46.0)
Hemoglobin: 8.2 g/dL — ABNORMAL LOW (ref 12.0–15.0)
MCH: 31.7 pg (ref 26.0–34.0)
MCHC: 34.4 g/dL (ref 30.0–36.0)
MCHC: 35.2 g/dL (ref 30.0–36.0)
MCV: 90.2 fL (ref 78.0–100.0)
Platelets: 211 10*3/uL (ref 150–400)
RDW: 15.5 % (ref 11.5–15.5)
WBC: 6.1 10*3/uL (ref 4.0–10.5)

## 2012-03-24 LAB — MRSA PCR SCREENING: MRSA by PCR: NEGATIVE

## 2012-03-24 SURGERY — EGD (ESOPHAGOGASTRODUODENOSCOPY)
Anesthesia: Moderate Sedation

## 2012-03-24 MED ORDER — FENTANYL CITRATE 0.05 MG/ML IJ SOLN
INTRAMUSCULAR | Status: AC
  Filled 2012-03-24: qty 2

## 2012-03-24 MED ORDER — POTASSIUM CHLORIDE IN NACL 40-0.9 MEQ/L-% IV SOLN
INTRAVENOUS | Status: DC
  Administered 2012-03-25: 01:00:00 via INTRAVENOUS
  Filled 2012-03-24 (×2): qty 1000

## 2012-03-24 MED ORDER — BUTAMBEN-TETRACAINE-BENZOCAINE 2-2-14 % EX AERO
INHALATION_SPRAY | CUTANEOUS | Status: DC | PRN
  Administered 2012-03-24: 2 via TOPICAL

## 2012-03-24 MED ORDER — MIDAZOLAM HCL 10 MG/2ML IJ SOLN
INTRAMUSCULAR | Status: AC
  Filled 2012-03-24: qty 2

## 2012-03-24 MED ORDER — SODIUM CHLORIDE 0.9 % IV SOLN: INTRAVENOUS | Status: DC

## 2012-03-24 MED ORDER — BIOTENE DRY MOUTH MT LIQD
15.0000 mL | Freq: Two times a day (BID) | OROMUCOSAL | Status: DC
  Administered 2012-03-24 (×2): 15 mL via OROMUCOSAL

## 2012-03-24 MED ORDER — CHLORHEXIDINE GLUCONATE 0.12 % MT SOLN
15.0000 mL | Freq: Two times a day (BID) | OROMUCOSAL | Status: DC
  Administered 2012-03-24: 15 mL via OROMUCOSAL
  Filled 2012-03-24 (×4): qty 15

## 2012-03-24 MED ORDER — PANTOPRAZOLE SODIUM 40 MG IV SOLR
40.0000 mg | Freq: Two times a day (BID) | INTRAVENOUS | Status: DC
  Administered 2012-03-24 (×2): 40 mg via INTRAVENOUS
  Filled 2012-03-24 (×5): qty 40

## 2012-03-24 MED ORDER — FENTANYL CITRATE 0.05 MG/ML IJ SOLN
INTRAMUSCULAR | Status: DC | PRN
  Administered 2012-03-24 (×2): 25 ug via INTRAVENOUS

## 2012-03-24 MED ORDER — MIDAZOLAM HCL 10 MG/2ML IJ SOLN
INTRAMUSCULAR | Status: DC | PRN
  Administered 2012-03-24: 1 mg via INTRAVENOUS
  Administered 2012-03-24 (×2): 2 mg via INTRAVENOUS

## 2012-03-24 NOTE — Progress Notes (Signed)
CARE MANAGEMENT NOTE 03/24/2012  Patient:  SPARKLE, GABEL   Account Number:  [id number]  Date Initiated:  03/24/2012  Documentation initiated by:  Brandace Cargle  Subjective/Objective Assessment:   pt with bloody hemoptysis and emsis, othrostatic bp, hgb 8.9- endo- pre pyloric ulcer and treatment am of [id number]     Action/Plan:   home   Anticipated DC Date:  03/27/2012   Anticipated DC Plan:  HOME/SELF CARE  In-house referral  NA      DC Planning Services  NA      PAC Choice  NA   Choice offered to / List presented to:  NA   DME arranged  NA      DME agency  NA     HH arranged  NA      HH agency  NA   Status of service:  In process, will continue to follow Medicare Important Message given?  NA - LOS <3 / Initial given by admissions (If response is "NO", the following Medicare IM given date fields will be blank) Date Medicare IM given:   Date Additional Medicare IM given:    Discharge Disposition:    Per UR Regulation:  Reviewed for med. necessity/level of care/duration of stay  If discussed at Long Length of Stay Meetings, dates discussed:    Comments:  [id number] Earlene Plater, RN, BSN, CCM: CHART REVIEWED AND UPDATED. NO DISCHARGE NEEDS PRESENT AT THIS TIME. CASE MANAGEMENT [phone]

## 2012-03-24 NOTE — Interval H&P Note (Signed)
History and Physical Interval Note:  03/24/2012 7:49 AM  Cristina Sanchez  has presented today for surgery, with the diagnosis of hematemesis,melena  The various methods of treatment have been discussed with the patient and family. After consideration of risks, benefits and other options for treatment, the patient has consented to  Procedure(s) (LRB) with comments: ESOPHAGOGASTRODUODENOSCOPY (EGD) (N/A) as a surgical intervention .  The patient's history has been reviewed, patient examined, no change in status, stable for surgery.  I have reviewed the patient's chart and labs.  Questions were answered to the patient's satisfaction.     Rob Bunting

## 2012-03-24 NOTE — Progress Notes (Signed)
Subjective: No specific complaints.  Denies any chest pain or shortness of breath.  Had EGD this morning.  Objective: Vital signs in last 24 hours: Filed Vitals:   03/24/12 0900 03/24/12 0917 03/24/12 0930 03/24/12 1000  BP: 105/67 100/65 112/75 114/74  Pulse:   74 75  Temp: 98.4 F (36.9 C)     TempSrc:      Resp: 18  17 16   Height:      Weight:      SpO2: 100% 100% 100% 100%   Weight change:   Intake/Output Summary (Last 24 hours) at 03/24/12 1037 Last data filed at 03/24/12 1019  Gross per 24 hour  Intake   2250 ml  Output    450 ml  Net   1800 ml    Physical Exam: General: Awake, Oriented, No acute distress. HEENT: EOMI. Neck: Supple CV: S1 and S2 Lungs: Clear to ascultation bilaterally Abdomen: Soft, Nontender, Nondistended, +bowel sounds. Ext: Good pulses. Trace edema.  Lab Results: Basic Metabolic Panel:  Lab 03/23/12 4098  NA 135  K 3.4*  CL 102  CO2 24  GLUCOSE 123*  BUN 44*  CREATININE 0.59  CALCIUM 8.8  MG --  PHOS --   Liver Function Tests:  Lab 03/23/12 2010  AST 14  ALT 12  ALKPHOS 44  BILITOT 0.3  PROT 6.2  ALBUMIN 3.4*    Lab 03/23/12 2010  LIPASE 31  AMYLASE --   No results found for this basename: AMMONIA:5 in the last 168 hours CBC:  Lab 03/23/12 2357 03/23/12 2010  WBC 7.6 11.0*  NEUTROABS -- 9.7*  HGB 8.2* 10.5*  HCT 24.1* 30.2*  MCV 94.1 94.7  PLT 271 368   Cardiac Enzymes: No results found for this basename: CKTOTAL:5,CKMB:5,CKMBINDEX:5,TROPONINI:5 in the last 168 hours BNP (last 3 results) No results found for this basename: PROBNP:3 in the last 8760 hours CBG: No results found for this basename: GLUCAP:5 in the last 168 hours No results found for this basename: HGBA1C:5 in the last 72 hours Other Labs: No components found with this basename: POCBNP:3 No results found for this basename: DDIMER:2 in the last 168 hours No results found for this basename: CHOL:2,HDL:2,LDLCALC:2,TRIG:2,CHOLHDL:2,LDLDIRECT:2 in  the last 168 hours No results found for this basename: TSH,T4TOTAL,FREET3,T3FREE,FREET4,THYROIDAB in the last 168 hours No results found for this basename: VITAMINB12:2,FOLATE:2,FERRITIN:2,TIBC:2,IRON:2,RETICCTPCT:2 in the last 168 hours  Micro Results: Recent Results (from the past 240 hour(s))  MRSA PCR SCREENING     Status: Normal   Collection Time   03/24/12  1:25 AM      Component Value Range Status Comment   MRSA by PCR NEGATIVE  NEGATIVE Final     Studies/Results: Dg Chest Port 1 View  03/23/2012  *RADIOLOGY REPORT*  Clinical Data: Nausea, vomiting and weakness.  PORTABLE CHEST - 1 VIEW  Comparison: None  Findings: The cardiomediastinal silhouette is unremarkable. The lungs are clear. There is no evidence of focal airspace disease, pulmonary edema, suspicious pulmonary nodule/mass, pleural effusion, or pneumothorax. No acute bony abnormalities are identified.  IMPRESSION: No evidence of active cardiopulmonary disease.   Original Report Authenticated By: Rosendo Gros, M.D.     Medications: I have reviewed the patient's current medications. Scheduled Meds:   . antiseptic oral rinse  15 mL Mouth Rinse q12n4p  . chlorhexidine  15 mL Mouth Rinse BID  . pantoprazole (PROTONIX) IV  80 mg Intravenous Once  . pantoprazole (PROTONIX) IV  40 mg Intravenous Q12H  . sodium chloride  1,000  mL Intravenous Once  . sodium chloride  1,000 mL Intravenous Once  . sodium chloride  1,000 mL Intravenous Once  . sodium chloride  3 mL Intravenous Q12H   Continuous Infusions:   . 0.9 % NaCl with KCl 40 mEq / L 100 mL/hr (03/24/12 1012)  . DISCONTD: sodium chloride    . DISCONTD: pantoprozole (PROTONIX) infusion 8 mg/hr (03/23/12 2156)   PRN Meds:.sodium chloride, morphine injection, ondansetron (ZOFRAN) IV, ondansetron, sodium chloride, DISCONTD: butamben-tetracaine-benzocaine, DISCONTD: fentaNYL, DISCONTD: midazolam  Assessment/Plan: Upper GI bleed Patient had 2 units of PRBC transfused on  03/23/2012.  Patient had EGD on 03/24/2012 done by Dr. Christella Hartigan which showed clean-based ulcer in prepyloric region with partial gastric outlet obstruction with resulting reflux esophagitis. Ulcer is likely NSAID related.  Recommended continuing IV PPI twice daily for now and at discharge transitioned to oral twice daily for at least one month then daily thereafter.  Biopsy for H. Pylori pending. To followup with Dr. Bosie Clos ([name]) as outpatient.  Acute blood loss anemia Patient is status post 2 unit of pRBC. Hemoglobin stable. Trend CBC.  Migraines Asymptomatic at this time.   Fibromyalgia Not problematic.   Hypokalemia Replete as needed.   Prophylaxis SCDs.  Disposition Transfer to med-surg, no events on telemetry.   LOS: 1 day  Kaiah Hosea A, MD 03/24/2012, 10:37 AM

## 2012-03-24 NOTE — Op Note (Signed)
[facility] [address] [place], [address]   ENDOSCOPY PROCEDURE REPORT  PATIENT: Cristina Sanchez, Cristina Sanchez  MR#: [id number] BIRTHDATE: April 25, 1961 , 51  yrs. old GENDER: Female ENDOSCOPIST: Rachael Fee, MD REFERRED BY:  Dr. Brien Few, [facility] PROCEDURE DATE:  03/24/2012 PROCEDURE:  EGD w/ biopsy ASA CLASS:     Class III INDICATIONS:  hematemesis, anemia. MEDICATIONS: Fentanyl 50 mcg IV and Versed 5 mg IV TOPICAL ANESTHETIC: Cetacaine Spray  DESCRIPTION OF PROCEDURE: After the risks benefits and alternatives of the procedure were thoroughly explained, informed consent was obtained.  The endoscope [id number] endoscope was introduced through the mouth and advanced to the second portion of the duodenum. Without limitations.  The instrument was slowly withdrawn as the mucosa was fully examined.  There was a 1.5cm clean based pre-pyloric ulcer without visible vessels or active bleeding.  The mucosa adjacent to the ulcer was edematous, friable but not neoplastic.  The ulcer was causing signficant anatomic distortion, minor narrowing of pylorus.  There was moderate amount of retained gastric contents (solid and liquid).  There was 1-2cm linear, ulcerative esophagitis, consistent with reflux/acid damage.   Minor gastritis was biopsied and sent to pathology.  Retroflexed views revealed no abnormalities.   The scope was then withdrawn from the patient and the procedure completed. COMPLICATIONS: There were no complications.  ENDOSCOPIC IMPRESSION: Pre-pylyoric, clean based ulcer causing anatomic distortion and partial gastric outlet obstruction with resulting reflux esophagitis.  Mild gastritis was biopsied to check for H. pylori. The ulcer is likely NSAID related.  RECOMMENDATIONS: Continue IV PPI, twice daily.  She was not on PPI prior to this, will need to be on twice daily orally for at least a month, then probably ok to decrease to once daily. If biopsies show  H.  pylori, will treat. No NSAIDs for now. Clear liquids, antiemetics. I will communicate with Dr.  [facility] about outpatient follow up.   eSigned:  Rachael Fee, MD 03/24/2012 8:30 AM   CC: Dr. Bosie Clos

## 2012-03-25 ENCOUNTER — Encounter (HOSPITAL_COMMUNITY): Payer: Self-pay

## 2012-03-25 ENCOUNTER — Encounter (HOSPITAL_COMMUNITY): Payer: Self-pay | Admitting: Gastroenterology

## 2012-03-25 LAB — TYPE AND SCREEN: Unit division: 0

## 2012-03-25 LAB — CBC
MCHC: 35.9 g/dL (ref 30.0–36.0)
Platelets: 237 10*3/uL (ref 150–400)
RDW: 15.1 % (ref 11.5–15.5)
WBC: 5.7 10*3/uL (ref 4.0–10.5)

## 2012-03-25 MED ORDER — ONDANSETRON HCL 4 MG PO TABS: 4.0000 mg | ORAL_TABLET | Freq: Four times a day (QID) | ORAL | Status: DC | PRN

## 2012-03-25 MED ORDER — PANTOPRAZOLE SODIUM 40 MG PO TBEC
40.0000 mg | DELAYED_RELEASE_TABLET | Freq: Two times a day (BID) | ORAL | Status: DC
  Administered 2012-03-25: 40 mg via ORAL
  Filled 2012-03-25 (×2): qty 1

## 2012-03-25 MED ORDER — ZOLPIDEM TARTRATE 5 MG PO TABS
5.0000 mg | ORAL_TABLET | Freq: Once | ORAL | Status: AC
  Administered 2012-03-25: 5 mg via ORAL
  Filled 2012-03-25: qty 1

## 2012-03-25 MED ORDER — OMEPRAZOLE 20 MG PO CPDR: 20.0000 mg | DELAYED_RELEASE_CAPSULE | Freq: Two times a day (BID) | ORAL | Status: DC

## 2012-03-25 NOTE — Progress Notes (Signed)
[facility] Progress Note    Since last GI note: EGD yesterday, see full report in chart.  No further bleeding.  NO nausea,  She ate salad last night, felt well. No BMs.  She is interested in going home today if possible   Objective: Vital signs in last 24 hours: Temp:  [97.6 F (36.4 C)-98.4 F (36.9 C)] 97.9 F (36.6 C) (10/17 0705) Pulse Rate:  [74-87] 75  (10/17 0705) Resp:  [10-25] 20  (10/17 0705) BP: (100-123)/(65-90) 115/70 mmHg (10/17 0705) SpO2:  [99 %-100 %] 100 % (10/17 0705) Weight:  [141 lb (63.957 kg)] 141 lb (63.957 kg) (10/16 1637) Last BM Date: 03/24/12 General: alert and oriented times 3 Heart: regular rate and rythm Abdomen: soft, non-tender, non-distended, normal bowel sounds   Lab Results:  Basename 03/25/12 0347 03/24/12 1235 03/23/12 2357  WBC 5.7 6.1 7.6  HGB 10.8* 10.0* 8.2*  PLT 237 211 271  MCV 88.8 90.2 94.1    Basename 03/23/12 2010  NA 135  K 3.4*  CL 102  CO2 24  GLUCOSE 123*  BUN 44*  CREATININE 0.59  CALCIUM 8.8    Basename 03/23/12 2010  PROT 6.2  ALBUMIN 3.4*  AST 14  ALT 12  ALKPHOS 44  BILITOT 0.3  BILIDIR --  IBILI --    Basename 03/23/12 2010  INR 1.08    medications: Scheduled Meds:   . antiseptic oral rinse  15 mL Mouth Rinse q12n4p  . chlorhexidine  15 mL Mouth Rinse BID  . pantoprazole (PROTONIX) IV  40 mg Intravenous Q12H  . sodium chloride  3 mL Intravenous Q12H  . zolpidem  5 mg Oral Once   Continuous Infusions:   . 0.9 % NaCl with KCl 40 mEq / L 50 mL/hr at 03/25/12 0127  . DISCONTD: sodium chloride    . DISCONTD: 0.9 % NaCl with KCl 40 mEq / L 100 mL/hr (03/24/12 1012)  . DISCONTD: pantoprozole (PROTONIX) infusion 8 mg/hr (03/23/12 2156)   PRN Meds:.sodium chloride, morphine injection, ondansetron (ZOFRAN) IV, ondansetron, sodium chloride, DISCONTD: butamben-tetracaine-benzocaine, DISCONTD: fentaNYL, DISCONTD: midazolam    Assessment/Plan: 51 y.o. female with prepyloric  ulcer causing partial gastric outlet obstruction, bleeding, reflux esophagitits  Likely ulcer is from NSAIDs, but biopsies taken to check for H. Pylori. Results pending.  If positive, then will treat with appropriate antibiotics.  Will change to PO PPI. She should be one PPI BID for one month, then OK to decrease to once daily for as long as she requires NSAIDs. She will need follow up appt with Dr. Bosie Clos in 3-4 weeks in office and from there she will likely be scheduled for repeat EGD to confirm ulcer healing.  Should limit NSAIDs as best as possible.  If she tolerates BF and lunch well, no recurrent bleeding she is ok to go home later. Will need the [facility] appt set up.    Rob Bunting, MD  03/25/2012, 7:57 AM [facility] Pager [phone]

## 2012-03-25 NOTE — Discharge Summary (Signed)
Physician Discharge Summary  Cristina Sanchez XBJ:[id number] DOB: 06/02/1961 DOA: 03/23/2012  PCP: Cristina Alken, MD  Admit date: 03/23/2012 Discharge date: 03/25/2012  Recommendations for Outpatient Follow-up:  Followup with Cristina Alken, MD (PCP) in 1 week, please have CBC checked at the next clinic visit.  Followup with Cristina Sanchez (GI) in 2-3 weeks, discuss about need for repeat EGD.  GI, Cristina Sanchez or Cristina Sanchez to notify you of the biopsy results.  No NSAID for now, discuss with Cristina Sanchez when it would be possible to restart them.  Discharge Diagnoses:  Active Problems:  GI bleed  Anemia  Migraines  Fibromyalgia  Gastric ulcer  Esophagitis   Discharge Condition: Stable  Diet recommendation: General diet  Filed Weights   03/24/12 0123 03/24/12 1637  Weight: 64.9 kg (143 lb 1.3 oz) 63.957 kg (141 lb)    History of present illness:  51 year old female, with known history of Rosacea, GERD, Fibromyalgia, migraines, s/p Hysterectomy, s/p I&D of left index finger whitlow, pelvic fracture, following fall in spring 2013, left shoulder bursitis, presented with abdominal pain and coffee ground emesis.  Hospital Course:  Upper GI bleed Patient had 2 units of PRBC transfused on 03/23/2012.  Patient had EGD on 03/24/2012 done by Cristina Sanchez which showed clean-based ulcer in prepyloric region with partial gastric outlet obstruction with resulting reflux esophagitis. Ulcer is likely NSAID related.  Continue PPI twice daily for one month then daily thereafter.  Biopsy for H. Pylori pending. To followup with Cristina Sanchez (PCP [facility]) as outpatient.  Acute blood loss anemia Patient is status post 2 unit of pRBC. Hemoglobin stable. Trend CBC.  Migraines Asymptomatic at this time.   Fibromyalgia Not problematic.   Hypokalemia Replete as needed.   Procedures:  EGD on 03/24/2012  Consultations:  Cristina Sanchez, GI  Discharge Exam: Filed Vitals:   03/24/12 1300 03/24/12 1637 03/24/12 2300 03/25/12 0705  BP:  103/90 119/82 115/70  Pulse: 87 85 77 75  Temp:  97.9 F (36.6 C) 97.6 F (36.4 C) 97.9 F (36.6 C)  TempSrc:  Oral Oral Oral  Resp: 21 20 18 20   Height:  5\' 6"  (1.676 m)    Weight:  63.957 kg (141 lb)    SpO2: 100% 100% 100% 100%   Discharge Instructions  Discharge Orders    Future Orders Please Complete By Expires   Diet general      Increase activity slowly      Discharge instructions      Comments:   Followup with Cristina Alken, MD (PCP) in 1 week, please have CBC checked at the next clinic visit. Followup with Cristina Sanchez (GI) in 2-3 weeks, discuss about need for repeat EGD. No NSAID for now, discuss with Cristina Sanchez when it would be possible to restart them. GI, Cristina Sanchez or Cristina Sanchez to notify you of the biopsy results.       Medication List     As of 03/25/2012  9:29 AM    STOP taking these medications         ibuprofen 200 MG tablet   Commonly known as: ADVIL,MOTRIN      naproxen 375 MG tablet   Commonly known as: NAPROSYN      TAKE these medications         b complex vitamins tablet   Take 1 tablet by mouth daily.      cholecalciferol 1000 UNITS tablet   Commonly known as: VITAMIN D   Take  1,000 Units by mouth daily.      cyclobenzaprine 10 MG tablet   Commonly known as: FLEXERIL   Take 10 mg by mouth 3 (three) times daily as needed. Muscle spasms      gabapentin 600 MG tablet   Commonly known as: NEURONTIN   Take 600 mg by mouth 3 (three) times daily.      multivitamin with minerals Tabs   Take 1 tablet by mouth daily.      omeprazole 20 MG capsule   Commonly known as: PRILOSEC   Take 1 capsule (20 mg total) by mouth 2 (two) times daily. Twice daily for one month the once daily there after.      ondansetron 4 MG tablet   Commonly known as: ZOFRAN   Take 1 tablet (4 mg total) by mouth every 6 (six) hours as needed for nausea.           Follow-up Information     Follow up with Cristina Alken, MD. Schedule an appointment as soon as possible for a visit in 1 week. (Please have CBC checked at the next clinic visit)    Contact information:   [address] [place] [address] [phone]       Follow up with Cristina Friar., MD. Schedule an appointment as soon as possible for a visit in 2 weeks.   Contact information:   [address] AND [address] [place] [address] [phone]           The results of significant diagnostics from this hospitalization (including imaging, microbiology, ancillary and laboratory) are listed below for reference.    Significant Diagnostic Studies: Dg Chest Port 1 View  03/23/2012  *RADIOLOGY REPORT*  Clinical Data: Nausea, vomiting and weakness.  PORTABLE CHEST - 1 VIEW  Comparison: None  Findings: The cardiomediastinal silhouette is unremarkable. The lungs are clear. There is no evidence of focal airspace disease, pulmonary edema, suspicious pulmonary nodule/mass, pleural effusion, or pneumothorax. No acute bony abnormalities are identified.  IMPRESSION: No evidence of active cardiopulmonary disease.   Original Report Authenticated By: Cristina Sanchez, M.D.     Microbiology: Recent Results (from the past 240 hour(s))  MRSA PCR SCREENING     Status: Normal   Collection Time   03/24/12  1:25 AM      Component Value Range Status Comment   MRSA by PCR NEGATIVE  NEGATIVE Final      Labs: Basic Metabolic Panel:  Lab 03/23/12 5784  NA 135  K 3.4*  CL 102  CO2 24  GLUCOSE 123*  BUN 44*  CREATININE 0.59  CALCIUM 8.8  MG --  PHOS --   Liver Function Tests:  Lab 03/23/12 2010  AST 14  ALT 12  ALKPHOS 44  BILITOT 0.3  PROT 6.2  ALBUMIN 3.4*    Lab 03/23/12 2010  LIPASE 31  AMYLASE --   No results found for this basename: AMMONIA:5 in the last 168 hours CBC:  Lab 03/25/12 0347 03/24/12 1235 03/23/12 2357 03/23/12 2010  WBC 5.7  6.1 7.6 11.0*  NEUTROABS -- -- -- 9.7*  HGB 10.8* 10.0* 8.2* 10.5*  HCT 30.1* 28.4* 24.1* 30.2*  MCV 88.8 90.2 94.1 94.7  PLT 237 211 271 368   Cardiac Enzymes: No results found for this basename: CKTOTAL:5,CKMB:5,CKMBINDEX:5,TROPONINI:5 in the last 168 hours BNP: BNP (last 3 results) No results found for this basename: PROBNP:3 in the last 8760 hours CBG: No results found  for this basename: GLUCAP:5 in the last 168 hours  Time coordinating discharge: 25 minutes  Signed:  Blakeley Margraf A  [facility] 03/25/2012, 9:29 AM

## 2012-03-25 NOTE — Progress Notes (Signed)
Subjective: No specific complaints.  Tolerating oral diet, wondering if she can go home today.  Objective: Vital signs in last 24 hours: Filed Vitals:   03/24/12 1300 03/24/12 1637 03/24/12 2300 03/25/12 0705  BP:  103/90 119/82 115/70  Pulse: 87 85 77 75  Temp:  97.9 F (36.6 C) 97.6 F (36.4 C) 97.9 F (36.6 C)  TempSrc:  Oral Oral Oral  Resp: 21 20 18 20   Height:  5\' 6"  (1.676 m)    Weight:  63.957 kg (141 lb)    SpO2: 100% 100% 100% 100%   Weight change: -0.943 kg (-2 lb 1.3 oz)  Intake/Output Summary (Last 24 hours) at 03/25/12 0924 Last data filed at 03/25/12 0708  Gross per 24 hour  Intake   2245 ml  Output   4350 ml  Net  -2105 ml    Physical Exam: General: Awake, Oriented, No acute distress. HEENT: EOMI. Neck: Supple CV: S1 and S2 Lungs: Clear to ascultation bilaterally Abdomen: Soft, Nontender, Nondistended, +bowel sounds. Ext: Good pulses. Trace edema.  Lab Results: Basic Metabolic Panel:  Lab 03/23/12 4098  NA 135  K 3.4*  CL 102  CO2 24  GLUCOSE 123*  BUN 44*  CREATININE 0.59  CALCIUM 8.8  MG --  PHOS --   Liver Function Tests:  Lab 03/23/12 2010  AST 14  ALT 12  ALKPHOS 44  BILITOT 0.3  PROT 6.2  ALBUMIN 3.4*    Lab 03/23/12 2010  LIPASE 31  AMYLASE --   No results found for this basename: AMMONIA:5 in the last 168 hours CBC:  Lab 03/25/12 0347 03/24/12 1235 03/23/12 2357 03/23/12 2010  WBC 5.7 6.1 7.6 11.0*  NEUTROABS -- -- -- 9.7*  HGB 10.8* 10.0* 8.2* 10.5*  HCT 30.1* 28.4* 24.1* 30.2*  MCV 88.8 90.2 94.1 94.7  PLT 237 211 271 368   Cardiac Enzymes: No results found for this basename: CKTOTAL:5,CKMB:5,CKMBINDEX:5,TROPONINI:5 in the last 168 hours BNP (last 3 results) No results found for this basename: PROBNP:3 in the last 8760 hours CBG: No results found for this basename: GLUCAP:5 in the last 168 hours No results found for this basename: HGBA1C:5 in the last 72 hours Other Labs: No components found with this  basename: POCBNP:3 No results found for this basename: DDIMER:2 in the last 168 hours No results found for this basename: CHOL:2,HDL:2,LDLCALC:2,TRIG:2,CHOLHDL:2,LDLDIRECT:2 in the last 168 hours No results found for this basename: TSH,T4TOTAL,FREET3,T3FREE,FREET4,THYROIDAB in the last 168 hours No results found for this basename: VITAMINB12:2,FOLATE:2,FERRITIN:2,TIBC:2,IRON:2,RETICCTPCT:2 in the last 168 hours  Micro Results: Recent Results (from the past 240 hour(s))  MRSA PCR SCREENING     Status: Normal   Collection Time   03/24/12  1:25 AM      Component Value Range Status Comment   MRSA by PCR NEGATIVE  NEGATIVE Final     Studies/Results: Dg Chest Port 1 View  03/23/2012  *RADIOLOGY REPORT*  Clinical Data: Nausea, vomiting and weakness.  PORTABLE CHEST - 1 VIEW  Comparison: None  Findings: The cardiomediastinal silhouette is unremarkable. The lungs are clear. There is no evidence of focal airspace disease, pulmonary edema, suspicious pulmonary nodule/mass, pleural effusion, or pneumothorax. No acute bony abnormalities are identified.  IMPRESSION: No evidence of active cardiopulmonary disease.   Original Report Authenticated By: Rosendo Gros, M.D.     Medications: I have reviewed the patient's current medications. Scheduled Meds:    . antiseptic oral rinse  15 mL Mouth Rinse q12n4p  . chlorhexidine  15 mL  Mouth Rinse BID  . pantoprazole  40 mg Oral BID  . sodium chloride  3 mL Intravenous Q12H  . zolpidem  5 mg Oral Once  . DISCONTD: pantoprazole (PROTONIX) IV  40 mg Intravenous Q12H   Continuous Infusions:    . DISCONTD: 0.9 % NaCl with KCl 40 mEq / L 100 mL/hr (03/24/12 1012)  . DISCONTD: 0.9 % NaCl with KCl 40 mEq / L 50 mL/hr at 03/25/12 0127   PRN Meds:.sodium chloride, morphine injection, ondansetron (ZOFRAN) IV, ondansetron, sodium chloride  Assessment/Plan: Upper GI bleed Patient had 2 units of PRBC transfused on 03/23/2012.  Patient had EGD on 03/24/2012 done  by Dr. Christella Hartigan which showed clean-based ulcer in prepyloric region with partial gastric outlet obstruction with resulting reflux esophagitis. Ulcer is likely NSAID related.  Continue PPI twice daily for one month then daily thereafter.  Biopsy for H. Pylori pending. To followup with Dr. Bosie Clos ([name]) as outpatient.  Acute blood loss anemia Patient is status post 2 unit of pRBC. Hemoglobin stable. Trend CBC.  Migraines Asymptomatic at this time.   Fibromyalgia Not problematic.   Hypokalemia Replete as needed.   Prophylaxis SCDs.  Disposition Discharge the patient today.   LOS: 2 days  Lakoda Raske A, MD 03/25/2012, 9:24 AM

## 2012-07-07 ENCOUNTER — Other Ambulatory Visit: Payer: Self-pay | Admitting: Family Medicine

## 2012-07-07 DIAGNOSIS — Z1231 Encounter for screening mammogram for malignant neoplasm of breast: Secondary | ICD-10-CM

## 2012-08-02 ENCOUNTER — Ambulatory Visit
Admission: RE | Admit: 2012-08-02 | Discharge: 2012-08-02 | Disposition: A | Discharge status: Home / Self Care | Payer: BC Managed Care – PPO | Source: Ambulatory Visit | Attending: Family Medicine | Admitting: Family Medicine

## 2012-08-02 DIAGNOSIS — Z1231 Encounter for screening mammogram for malignant neoplasm of breast: Secondary | ICD-10-CM

## 2012-08-04 ENCOUNTER — Other Ambulatory Visit: Payer: Self-pay | Admitting: Family Medicine

## 2012-08-04 DIAGNOSIS — R928 Other abnormal and inconclusive findings on diagnostic imaging of breast: Secondary | ICD-10-CM

## 2012-08-17 ENCOUNTER — Ambulatory Visit
Admission: RE | Admit: 2012-08-17 | Discharge: 2012-08-17 | Disposition: A | Discharge status: Home / Self Care | Payer: BC Managed Care – PPO | Source: Ambulatory Visit | Attending: Family Medicine | Admitting: Family Medicine

## 2012-08-17 DIAGNOSIS — R928 Other abnormal and inconclusive findings on diagnostic imaging of breast: Secondary | ICD-10-CM

## 2012-11-15 ENCOUNTER — Other Ambulatory Visit: Payer: Self-pay | Admitting: Radiation Oncology

## 2012-11-15 ENCOUNTER — Other Ambulatory Visit (HOSPITAL_COMMUNITY): Payer: Self-pay | Admitting: Urology

## 2012-11-15 DIAGNOSIS — R519 Headache, unspecified: Secondary | ICD-10-CM

## 2012-11-16 ENCOUNTER — Other Ambulatory Visit: Payer: Self-pay | Admitting: Urology

## 2012-11-17 ENCOUNTER — Other Ambulatory Visit (HOSPITAL_COMMUNITY): Payer: Self-pay | Admitting: Urology

## 2012-11-17 ENCOUNTER — Ambulatory Visit (HOSPITAL_COMMUNITY)
Admission: RE | Admit: 2012-11-17 | Discharge: 2012-11-17 | Disposition: A | Discharge status: Home / Self Care | Payer: BC Managed Care – PPO | Source: Ambulatory Visit | Attending: Urology | Admitting: Urology

## 2012-11-17 ENCOUNTER — Encounter (HOSPITAL_COMMUNITY): Payer: Self-pay

## 2012-11-17 ENCOUNTER — Ambulatory Visit (HOSPITAL_COMMUNITY)
Admission: RE | Admit: 2012-11-17 | Discharge: 2012-11-17 | Disposition: A | Discharge status: Home / Self Care | Payer: BC Managed Care – PPO | Source: Ambulatory Visit | Attending: Radiation Oncology | Admitting: Radiation Oncology

## 2012-11-17 DIAGNOSIS — R51 Headache: Secondary | ICD-10-CM | POA: Insufficient documentation

## 2012-11-17 DIAGNOSIS — C649 Malignant neoplasm of unspecified kidney, except renal pelvis: Secondary | ICD-10-CM | POA: Insufficient documentation

## 2012-11-17 MED ORDER — IOHEXOL 300 MG/ML  SOLN
100.0000 mL | Freq: Once | INTRAMUSCULAR | Status: AC | PRN
  Administered 2012-11-17: 100 mL via INTRAVENOUS

## 2012-11-22 ENCOUNTER — Encounter (HOSPITAL_COMMUNITY)
Admission: RE | Admit: 2012-11-22 | Discharge: 2012-11-22 | Disposition: A | Discharge status: Home / Self Care | Payer: BC Managed Care – PPO | Source: Ambulatory Visit | Attending: Urology | Admitting: Urology

## 2012-11-22 ENCOUNTER — Encounter (HOSPITAL_COMMUNITY): Payer: Self-pay | Admitting: Pharmacy Technician

## 2012-11-22 ENCOUNTER — Encounter (HOSPITAL_COMMUNITY): Payer: Self-pay

## 2012-11-22 DIAGNOSIS — N289 Disorder of kidney and ureter, unspecified: Secondary | ICD-10-CM | POA: Insufficient documentation

## 2012-11-22 DIAGNOSIS — Z01812 Encounter for preprocedural laboratory examination: Secondary | ICD-10-CM | POA: Insufficient documentation

## 2012-11-22 HISTORY — DX: Gastric ulcer, unspecified as acute or chronic, without hemorrhage or perforation: K25.9

## 2012-11-22 HISTORY — DX: Chondrocostal junction syndrome (tietze): M94.0

## 2012-11-22 HISTORY — DX: Malignant (primary) neoplasm, unspecified: C80.1

## 2012-11-22 LAB — CBC
HCT: 38.4 % (ref 36.0–46.0)
MCH: 31.3 pg (ref 26.0–34.0)
MCHC: 34.1 g/dL (ref 30.0–36.0)
MCV: 91.6 fL (ref 78.0–100.0)
Platelets: 330 10*3/uL (ref 150–400)
RDW: 12.8 % (ref 11.5–15.5)
WBC: 5 10*3/uL (ref 4.0–10.5)

## 2012-11-22 LAB — BASIC METABOLIC PANEL
BUN: 10 mg/dL (ref 6–23)
Calcium: 9.7 mg/dL (ref 8.4–10.5)
Creatinine, Ser: 0.66 mg/dL (ref 0.50–1.10)
GFR calc Af Amer: >90 mL/min (ref >90)
GFR calc non Af Amer: >90 mL/min (ref >90)

## 2012-11-22 LAB — SURGICAL PCR SCREEN: MRSA, PCR: NEGATIVE

## 2012-11-22 NOTE — Progress Notes (Signed)
EKG 03/23/12 on EPIC, Chest x-ray 11/17/12 on EPIC

## 2012-11-22 NOTE — Patient Instructions (Addendum)
[address] Shonita Rinck Mcclintic  11/22/2012   Your procedure is scheduled on: 11/26/12  Report to [facility] at 0515 AM.  Call this number if you have problems the morning of surgery [phone]: [phone]   Remember:   Do not eat food or drink liquids After Midnight.     Take these medicines the morning of surgery with A SIP OF WATER: gabapentin, prilosec   Do not wear jewelry, make-up or nail polish.  Do not wear lotions, powders, or perfumes. You may wear deodorant.  Do not shave 48 hours prior to surgery. Men may shave face and neck.  Do not bring valuables to the hospital.  Contacts, dentures or bridgework may not be worn into surgery.  Leave suitcase in the car. After surgery it may be brought to your room.  For patients admitted to the hospital, checkout time is 11:00 AM the day of discharge.    Please read over the following fact sheets that you were given: MRSA Information, blood fact sheet Birdie Sons, RN  pre op nurse call if needed [phone]    FAILURE TO FOLLOW THESE INSTRUCTIONS MAY RESULT IN CANCELLATION OF YOUR SURGERY   Patient Signature: ___________________________________________

## 2012-11-25 NOTE — Anesthesia Preprocedure Evaluation (Addendum)
Anesthesia Evaluation  Patient identified by MRN, date of birth, ID band Patient awake    Reviewed: Allergy & Precautions, H&P , NPO status , Patient's Chart, lab work & pertinent test results  Airway Mallampati: II TM Distance: >3 FB Neck ROM: full    Dental no notable dental hx. (+) Teeth Intact and Dental Advisory Given   Pulmonary neg pulmonary ROS,  breath sounds clear to auscultation  Pulmonary exam normal       Cardiovascular Exercise Tolerance: Good negative cardio ROS  Rhythm:regular Rate:Normal     Neuro/Psych negative neurological ROS  negative psych ROS   GI/Hepatic negative GI ROS, Neg liver ROS,   Endo/Other  negative endocrine ROS  Renal/GU negative Renal ROS  negative genitourinary   Musculoskeletal   Abdominal   Peds  Hematology negative hematology ROS (+)   Anesthesia Other Findings   Reproductive/Obstetrics negative OB ROS                          Anesthesia Physical Anesthesia Plan  ASA: II  Anesthesia Plan: General   Post-op Pain Management:    Induction: Intravenous  Airway Management Planned: Oral ETT  Additional Equipment:   Intra-op Plan:   Post-operative Plan: Extubation in OR  Informed Consent: I have reviewed the patients History and Physical, chart, labs and discussed the procedure including the risks, benefits and alternatives for the proposed anesthesia with the patient or authorized representative who has indicated his/her understanding and acceptance.   Dental Advisory Given  Plan Discussed with: CRNA and Surgeon  Anesthesia Plan Comments:         Anesthesia Quick Evaluation

## 2012-11-26 ENCOUNTER — Encounter (HOSPITAL_COMMUNITY)
Admission: RE | Disposition: A | Discharge status: Home / Self Care | Payer: Self-pay | Source: Ambulatory Visit | Attending: Urology

## 2012-11-26 ENCOUNTER — Inpatient Hospital Stay (HOSPITAL_COMMUNITY)
Admission: RE | Admit: 2012-11-26 | Discharge: 2012-11-29 | DRG: 303 | Disposition: A | Discharge status: Home / Self Care | Payer: BC Managed Care – PPO | Source: Ambulatory Visit | Attending: Urology | Admitting: Urology

## 2012-11-26 ENCOUNTER — Encounter (HOSPITAL_COMMUNITY): Payer: Self-pay | Admitting: *Deleted

## 2012-11-26 ENCOUNTER — Ambulatory Visit (HOSPITAL_COMMUNITY): Payer: BC Managed Care – PPO | Admitting: Anesthesiology

## 2012-11-26 ENCOUNTER — Encounter (HOSPITAL_COMMUNITY): Payer: Self-pay | Admitting: Anesthesiology

## 2012-11-26 DIAGNOSIS — K219 Gastro-esophageal reflux disease without esophagitis: Secondary | ICD-10-CM | POA: Diagnosis present

## 2012-11-26 DIAGNOSIS — G43909 Migraine, unspecified, not intractable, without status migrainosus: Secondary | ICD-10-CM | POA: Diagnosis present

## 2012-11-26 DIAGNOSIS — Z79899 Other long term (current) drug therapy: Secondary | ICD-10-CM

## 2012-11-26 DIAGNOSIS — IMO0001 Reserved for inherently not codable concepts without codable children: Secondary | ICD-10-CM | POA: Diagnosis present

## 2012-11-26 DIAGNOSIS — C649 Malignant neoplasm of unspecified kidney, except renal pelvis: Principal | ICD-10-CM | POA: Diagnosis present

## 2012-11-26 HISTORY — PX: ROBOT ASSISTED LAPAROSCOPIC NEPHRECTOMY: SHX5140

## 2012-11-26 LAB — BASIC METABOLIC PANEL
BUN: 10 mg/dL (ref 6–23)
Chloride: 100 mEq/L (ref 96–112)
Creatinine, Ser: 0.71 mg/dL (ref 0.50–1.10)
GFR calc Af Amer: >90 mL/min (ref >90)
GFR calc non Af Amer: >90 mL/min (ref >90)

## 2012-11-26 LAB — TYPE AND SCREEN: Antibody Screen: NEGATIVE

## 2012-11-26 LAB — HEMOGLOBIN AND HEMATOCRIT, BLOOD: Hemoglobin: 12.9 g/dL (ref 12.0–15.0)

## 2012-11-26 SURGERY — ROBOTIC ASSISTED LAPAROSCOPIC NEPHRECTOMY
Anesthesia: General | Laterality: Left | Wound class: Clean

## 2012-11-26 MED ORDER — STERILE WATER FOR IRRIGATION IR SOLN
Status: DC | PRN
  Administered 2012-11-26: 3000 mL

## 2012-11-26 MED ORDER — HYDROMORPHONE HCL PF 1 MG/ML IJ SOLN
0.2500 mg | INTRAMUSCULAR | Status: DC | PRN
  Administered 2012-11-26 (×4): 0.5 mg via INTRAVENOUS

## 2012-11-26 MED ORDER — BUPIVACAINE HCL (PF) 0.25 % IJ SOLN
INTRAMUSCULAR | Status: DC | PRN
  Administered 2012-11-26: 20 mL

## 2012-11-26 MED ORDER — DEXAMETHASONE SODIUM PHOSPHATE 10 MG/ML IJ SOLN
INTRAMUSCULAR | Status: DC | PRN
  Administered 2012-11-26: 10 mg via INTRAVENOUS

## 2012-11-26 MED ORDER — ONDANSETRON HCL 4 MG/2ML IJ SOLN
INTRAMUSCULAR | Status: DC | PRN
  Administered 2012-11-26: 4 mg via INTRAVENOUS

## 2012-11-26 MED ORDER — PROMETHAZINE HCL 25 MG/ML IJ SOLN
6.2500 mg | INTRAMUSCULAR | Status: DC | PRN
  Administered 2012-11-26: 6.25 mg via INTRAVENOUS

## 2012-11-26 MED ORDER — HYDROMORPHONE HCL PF 1 MG/ML IJ SOLN
INTRAMUSCULAR | Status: AC
  Administered 2012-11-26: 0.5 mg
  Filled 2012-11-26: qty 1

## 2012-11-26 MED ORDER — HYDROMORPHONE HCL PF 1 MG/ML IJ SOLN
INTRAMUSCULAR | Status: AC
  Filled 2012-11-26: qty 1

## 2012-11-26 MED ORDER — GABAPENTIN 300 MG PO CAPS
600.0000 mg | ORAL_CAPSULE | Freq: Two times a day (BID) | ORAL | Status: DC
  Administered 2012-11-26 – 2012-11-29 (×7): 600 mg via ORAL
  Filled 2012-11-26 (×9): qty 2

## 2012-11-26 MED ORDER — ACETAMINOPHEN 10 MG/ML IV SOLN
1000.0000 mg | Freq: Four times a day (QID) | INTRAVENOUS | Status: AC
  Administered 2012-11-26 – 2012-11-27 (×4): 1000 mg via INTRAVENOUS
  Filled 2012-11-26 (×4): qty 100

## 2012-11-26 MED ORDER — CYCLOBENZAPRINE HCL 10 MG PO TABS
10.0000 mg | ORAL_TABLET | Freq: Three times a day (TID) | ORAL | Status: DC | PRN
  Filled 2012-11-26: qty 1

## 2012-11-26 MED ORDER — FENTANYL CITRATE 0.05 MG/ML IJ SOLN
INTRAMUSCULAR | Status: DC | PRN
  Administered 2012-11-26 (×3): 50 ug via INTRAVENOUS
  Administered 2012-11-26: 100 ug via INTRAVENOUS
  Administered 2012-11-26 (×2): 50 ug via INTRAVENOUS

## 2012-11-26 MED ORDER — NEOSTIGMINE METHYLSULFATE 1 MG/ML IJ SOLN
INTRAMUSCULAR | Status: DC | PRN
  Administered 2012-11-26: 5 mg via INTRAVENOUS

## 2012-11-26 MED ORDER — LACTATED RINGERS IV SOLN: INTRAVENOUS | Status: DC

## 2012-11-26 MED ORDER — GLYCOPYRROLATE 0.2 MG/ML IJ SOLN
INTRAMUSCULAR | Status: DC | PRN
  Administered 2012-11-26: 0.2 mg via INTRAVENOUS
  Administered 2012-11-26: 0.6 mg via INTRAVENOUS

## 2012-11-26 MED ORDER — ROCURONIUM BROMIDE 100 MG/10ML IV SOLN
INTRAVENOUS | Status: DC | PRN
  Administered 2012-11-26: 20 mg via INTRAVENOUS
  Administered 2012-11-26: 50 mg via INTRAVENOUS

## 2012-11-26 MED ORDER — OXYCODONE HCL 5 MG PO TABS
5.0000 mg | ORAL_TABLET | ORAL | Status: DC | PRN
  Administered 2012-11-27 – 2012-11-28 (×3): 5 mg via ORAL
  Filled 2012-11-26 (×3): qty 1

## 2012-11-26 MED ORDER — HYDROMORPHONE HCL PF 1 MG/ML IJ SOLN
0.5000 mg | INTRAMUSCULAR | Status: DC | PRN
  Administered 2012-11-27 – 2012-11-28 (×4): 1 mg via INTRAVENOUS
  Filled 2012-11-26 (×4): qty 1

## 2012-11-26 MED ORDER — CEFAZOLIN SODIUM-DEXTROSE 2-3 GM-% IV SOLR
INTRAVENOUS | Status: AC
  Filled 2012-11-26: qty 50

## 2012-11-26 MED ORDER — CEFAZOLIN SODIUM-DEXTROSE 2-3 GM-% IV SOLR
2.0000 g | INTRAVENOUS | Status: AC
  Administered 2012-11-26: 2 g via INTRAVENOUS

## 2012-11-26 MED ORDER — LACTATED RINGERS IV SOLN
INTRAVENOUS | Status: DC | PRN
  Administered 2012-11-26: 07:00:00 via INTRAVENOUS

## 2012-11-26 MED ORDER — PROMETHAZINE HCL 25 MG/ML IJ SOLN
INTRAMUSCULAR | Status: AC
  Filled 2012-11-26: qty 1

## 2012-11-26 MED ORDER — GABAPENTIN 600 MG PO TABS
600.0000 mg | ORAL_TABLET | Freq: Two times a day (BID) | ORAL | Status: DC
  Filled 2012-11-26 (×2): qty 1

## 2012-11-26 MED ORDER — ADULT MULTIVITAMIN W/MINERALS CH
1.0000 | ORAL_TABLET | Freq: Every day | ORAL | Status: DC
  Administered 2012-11-26 – 2012-11-29 (×4): 1 via ORAL
  Filled 2012-11-26 (×4): qty 1

## 2012-11-26 MED ORDER — BUPIVACAINE LIPOSOME 1.3 % IJ SUSP
20.0000 mL | Freq: Once | INTRAMUSCULAR | Status: DC
  Filled 2012-11-26: qty 20

## 2012-11-26 MED ORDER — HYDROMORPHONE HCL PF 1 MG/ML IJ SOLN
0.2500 mg | INTRAMUSCULAR | Status: DC | PRN
  Administered 2012-11-26 (×2): 0.5 mg via INTRAVENOUS

## 2012-11-26 MED ORDER — MIDAZOLAM HCL 5 MG/5ML IJ SOLN
INTRAMUSCULAR | Status: DC | PRN
  Administered 2012-11-26: 2 mg via INTRAVENOUS

## 2012-11-26 MED ORDER — SENNA 8.6 MG PO TABS
1.0000 | ORAL_TABLET | Freq: Two times a day (BID) | ORAL | Status: DC
  Administered 2012-11-26 – 2012-11-29 (×6): 8.6 mg via ORAL
  Filled 2012-11-26 (×6): qty 1

## 2012-11-26 MED ORDER — ONDANSETRON HCL 4 MG/2ML IJ SOLN: 4.0000 mg | INTRAMUSCULAR | Status: DC | PRN

## 2012-11-26 MED ORDER — LACTATED RINGERS IR SOLN
Status: DC | PRN
  Administered 2012-11-26: 1000 mL

## 2012-11-26 MED ORDER — MELATONIN 3 MG PO TABS: 3.0000 mg | ORAL_TABLET | Freq: Every evening | ORAL | Status: DC | PRN

## 2012-11-26 MED ORDER — KETAMINE HCL 10 MG/ML IJ SOLN
INTRAMUSCULAR | Status: DC | PRN
  Administered 2012-11-26: 20 mg via INTRAVENOUS

## 2012-11-26 MED ORDER — PROPOFOL 10 MG/ML IV BOLUS
INTRAVENOUS | Status: DC | PRN
  Administered 2012-11-26: 150 mg via INTRAVENOUS

## 2012-11-26 MED ORDER — DOCUSATE SODIUM 100 MG PO CAPS
100.0000 mg | ORAL_CAPSULE | Freq: Two times a day (BID) | ORAL | Status: DC
  Administered 2012-11-26 – 2012-11-29 (×6): 100 mg via ORAL
  Filled 2012-11-26 (×7): qty 1

## 2012-11-26 MED ORDER — KCL IN DEXTROSE-NACL 20-5-0.45 MEQ/L-%-% IV SOLN
INTRAVENOUS | Status: DC
Start: 2012-11-26 — End: 2012-11-28
  Administered 2012-11-26 – 2012-11-28 (×4): via INTRAVENOUS
  Filled 2012-11-26 (×4): qty 1000

## 2012-11-26 MED ORDER — LIDOCAINE HCL (CARDIAC) 20 MG/ML IV SOLN
INTRAVENOUS | Status: DC | PRN
  Administered 2012-11-26: 100 mg via INTRAVENOUS

## 2012-11-26 MED ORDER — KCL IN DEXTROSE-NACL 20-5-0.45 MEQ/L-%-% IV SOLN
INTRAVENOUS | Status: AC
  Administered 2012-11-26: 13:00:00
  Filled 2012-11-26: qty 1000

## 2012-11-26 MED ORDER — PHENYLEPHRINE HCL 10 MG/ML IJ SOLN
INTRAMUSCULAR | Status: DC | PRN
  Administered 2012-11-26 (×2): 80 ug via INTRAVENOUS
  Administered 2012-11-26: 40 ug via INTRAVENOUS

## 2012-11-26 SURGICAL SUPPLY — 60 items
ADH SKN CLS APL DERMABOND .7 (GAUZE/BANDAGES/DRESSINGS) ×2
APL ESCP 34 STRL LF DISP (HEMOSTASIS)
APPLICATOR SURGIFLO ENDO (HEMOSTASIS) IMPLANT
CANNULA SEAL DVNC (CANNULA) ×3 IMPLANT
CANNULA SEALS DA VINCI (CANNULA) ×3
CHLORAPREP W/TINT 26ML (MISCELLANEOUS) ×2 IMPLANT
CLIP LIGATING HEM O LOK PURPLE (MISCELLANEOUS) ×1 IMPLANT
CLIP LIGATING HEMO LOK XL GOLD (MISCELLANEOUS) ×4 IMPLANT
CLIP LIGATING HEMO O LOK GREEN (MISCELLANEOUS) ×1 IMPLANT
CLOTH BEACON ORANGE TIMEOUT ST (SAFETY) ×2 IMPLANT
CORDS BIPOLAR (ELECTRODE) ×2 IMPLANT
COVER SURGICAL LIGHT HANDLE (MISCELLANEOUS) ×2 IMPLANT
COVER TIP SHEARS 8 DVNC (MISCELLANEOUS) ×1 IMPLANT
COVER TIP SHEARS 8MM DA VINCI (MISCELLANEOUS) ×1
CUTTER ECHEON FLEX ENDO 45 340 (ENDOMECHANICALS) ×1 IMPLANT
DECANTER SPIKE VIAL GLASS SM (MISCELLANEOUS) ×2 IMPLANT
DERMABOND ADVANCED (GAUZE/BANDAGES/DRESSINGS) ×2
DERMABOND ADVANCED .7 DNX12 (GAUZE/BANDAGES/DRESSINGS) ×2 IMPLANT
DRAIN CHANNEL 15F RND FF 3/16 (WOUND CARE) ×1 IMPLANT
DRAPE INCISE IOBAN 66X45 STRL (DRAPES) ×2 IMPLANT
DRAPE LAPAROSCOPIC ABDOMINAL (DRAPES) ×2 IMPLANT
DRAPE LG THREE QUARTER DISP (DRAPES) ×4 IMPLANT
DRAPE TABLE BACK 44X90 PK DISP (DRAPES) ×2 IMPLANT
DRAPE WARM FLUID 44X44 (DRAPE) ×2 IMPLANT
ELECT REM PT RETURN 9FT ADLT (ELECTROSURGICAL) ×2
ELECTRODE REM PT RTRN 9FT ADLT (ELECTROSURGICAL) ×2 IMPLANT
EVACUATOR SILICONE 100CC (DRAIN) ×1 IMPLANT
GAUZE VASELINE 3X9 (GAUZE/BANDAGES/DRESSINGS) IMPLANT
GLOVE BIOGEL M STRL SZ7.5 (GLOVE) ×4 IMPLANT
GOWN STRL NON-REIN LRG LVL3 (GOWN DISPOSABLE) ×4 IMPLANT
GOWN STRL REIN XL XLG (GOWN DISPOSABLE) ×2 IMPLANT
KIT ACCESSORY DA VINCI DISP (KITS) ×1
KIT ACCESSORY DVNC DISP (KITS) ×1 IMPLANT
KIT BASIN OR (CUSTOM PROCEDURE TRAY) ×2 IMPLANT
NDL INSUFFLATION 14GA 120MM (NEEDLE) ×1 IMPLANT
NEEDLE INSUFFLATION 14GA 120MM (NEEDLE) ×2 IMPLANT
PENCIL BUTTON HOLSTER BLD 10FT (ELECTRODE) ×2 IMPLANT
POSITIONER SURGICAL ARM (MISCELLANEOUS) ×3 IMPLANT
POUCH ENDO CATCH II 15MM (MISCELLANEOUS) ×1 IMPLANT
RELOAD WH ECHELON 45 (STAPLE) ×2 IMPLANT
RELOAD WHITE ECR60W (STAPLE) ×4 IMPLANT
SET TUBE IRRIG SUCTION NO TIP (IRRIGATION / IRRIGATOR) ×1 IMPLANT
SOLUTION ANTI FOG 6CC (MISCELLANEOUS) ×2 IMPLANT
SOLUTION ELECTROLUBE (MISCELLANEOUS) ×2 IMPLANT
SPONGE LAP 18X18 X RAY DECT (DISPOSABLE) ×1 IMPLANT
SPONGE LAP 4X18 X RAY DECT (DISPOSABLE) ×2 IMPLANT
STAPLE ECHEON FLEX 60 POW ENDO (STAPLE) ×1 IMPLANT
SURGIFLO W/THROMBIN 8M KIT (HEMOSTASIS) IMPLANT
SUT ETHILON 3 0 PS 1 (SUTURE) ×1 IMPLANT
SUT MNCRL AB 4-0 PS2 18 (SUTURE) ×4 IMPLANT
SUT PDS AB 0 CTX 36 PDP370T (SUTURE) ×2 IMPLANT
SUT PDS AB 1 TP1 54 (SUTURE) ×2 IMPLANT
SUT VICRYL 0 UR6 27IN ABS (SUTURE) ×5 IMPLANT
SYR BULB IRRIGATION 50ML (SYRINGE) IMPLANT
TOWEL OR NON WOVEN STRL DISP B (DISPOSABLE) ×4 IMPLANT
TRAY FOLEY CATH 14FRSI W/METER (CATHETERS) ×2 IMPLANT
TRAY LAP CHOLE (CUSTOM PROCEDURE TRAY) ×2 IMPLANT
TROCAR XCEL 12X100 BLDLESS (ENDOMECHANICALS) ×2 IMPLANT
TUBING INSUFFLATION 10FT LAP (TUBING) ×2 IMPLANT
WATER STERILE IRR 1500ML POUR (IV SOLUTION) ×2 IMPLANT

## 2012-11-26 NOTE — Progress Notes (Signed)
HGB. AND HCT. AND B MET DRAWN BY LAB.

## 2012-11-26 NOTE — Brief Op Note (Signed)
11/26/2012  9:52 AM  PATIENT:  Elvera Bicker  52 y.o. female  PRE-OPERATIVE DIAGNOSIS:  LEFT RENAL MASS  POST-OPERATIVE DIAGNOSIS:  LEFT RENAL MASS  PROCEDURE:  Procedure(s): ROBOTIC ASSISTED LAPAROSCOPIC NEPHRECTOMY (Left)  SURGEON:  Surgeon(s) and Role:    * Sebastian Ache, MD - Primary  PHYSICIAN ASSISTANT:   ASSISTANTS: Lujean Rave, PA   ANESTHESIA:   local and general  EBL:  Total I/O In: -  Out: 530 [Urine:500; Blood:30]  BLOOD ADMINISTERED:none  DRAINS: Foley - straight drain   LOCAL MEDICATIONS USED:  MARCAINE     SPECIMEN:  Source of Specimen:  1- Left Radical Nephrectomy, 2 - Left Periaortic lymph nodes  DISPOSITION OF SPECIMEN:  PATHOLOGY  COUNTS:  YES  TOURNIQUET:  * No tourniquets in log *  DICTATION: .Other Dictation: Dictation Number [id number]  PLAN OF CARE: Admit to inpatient   PATIENT DISPOSITION:  PACU - hemodynamically stable.   Delay start of Pharmacological VTE agent (>24hrs) due to surgical blood loss or risk of bleeding: yes

## 2012-11-26 NOTE — Anesthesia Postprocedure Evaluation (Signed)
  Anesthesia Post-op Note  Patient: Cristina Sanchez  Procedure(s) Performed: Procedure(s) (LRB): ROBOTIC ASSISTED LAPAROSCOPIC NEPHRECTOMY (Left)  Patient Location: PACU  Anesthesia Type: General  Level of Consciousness: awake and alert   Airway and Oxygen Therapy: Patient Spontanous Breathing  Post-op Pain: mild  Post-op Assessment: Post-op Vital signs reviewed, Patient's Cardiovascular Status Stable, Respiratory Function Stable, Patent Airway and No signs of Nausea or vomiting  Last Vitals:  Filed Vitals:   11/26/12 1045  BP: 105/56  Pulse: 41  Temp:   Resp: 10    Post-op Vital Signs: stable   Complications: No apparent anesthesia complications

## 2012-11-26 NOTE — Progress Notes (Signed)
DR. Leta Jungling made aware of patient's heart rates- O.K. To go to floor.

## 2012-11-26 NOTE — Op Note (Signed)
NAMETRINDA, HARLACHER NO.:  [id number]  MEDICAL RECORD NO.:  [id number]  LOCATION:  [facility]                         FACILITY:  [facility]  PHYSICIAN:  Sebastian Ache, MD     DATE OF BIRTH:  03-17-1961  DATE OF PROCEDURE:  11/26/2012 DATE OF DISCHARGE:                              OPERATIVE REPORT   DIAGNOSIS:  Large left renal mass.  PROCEDURE:  Robotic assisted laparoscopic left radical nephrectomy.  ASSISTANT:  Pecola Leisure, PA  FINDINGS: 1. Left double ureters. 2. Single artery, double vein left renovascular anatomy. 3. Left adrenal sparing performed. 4. Somewhat enlarged and matted left periaortic lymph nodes.  These     were dissected and set aside as a separate specimen.  SPECIMENS: 1. Left radical nephrectomy. 2. Left periaortic lymph nodes.  DRAINS:  Foley catheter to straight drain.  BLOOD LOSS:  Less than 50 mL.  COMPLICATIONS:  None.  INDICATION:  Cristina Sanchez is a pleasant 52 year old lady who was found on workup of persistent microscopic hematuria to have enhancing necrotic left central renal mass which is consistent most likely with renal cell carcinoma.  She underwent staging studies including CT of the head.  She had new worsening headaches and chest x-ray which were negative for distant disease.  Options were discussed including left nephrectomy, and she wished to proceed with minimally invasive assistance.  Informed consent was obtained and placed in medical record.  PROCEDURE IN DETAIL:  The patient being Cristina Sanchez, was verified. Procedure being left robotic partial nephrectomy was confirmed. Procedure was carried out.  Time-out was performed.  Intravenous antibiotics were administered.  General endotracheal anesthesia was introduced.  The patient placed into a left side up a full flank position applying 15 degrees stable flexion, sequential compression devices, superior arm elevator, axillary roll, and bean bag.  The  bottom leg was bent, top leg was straight.  She is further fashioned on the operative table using 3-inch tape over foam.  Sterile field was created by prepping and draping the patient's entire left flank and abdomen using chlorhexidine gluconate.  Next, a high-flow low pressure pneumoperitoneum was obtained using Veress technique in the left lower quadrant having passed the aspiration and drop test.  A 12-mm robotic camera port was then placed approximately 2 fingerbreadths superior lateral to the umbilicus.  Laparoscopic examination of peritoneal cavity revealed no significant adhesions, no visceral injury.  Additional ports placed as follows.  Left subcostal 8-mm robotic port, left paramedian suprapubic 8-mm robotic port, left far lateral 8-mm robotic port, 4 fingerbreadths superior medial to the anterior superior iliac spine, and two 12-mm system port was placed in the midline.  One 3 fingerbreadths above, in addition one 3 fingerbreadths below the camera port.  Robot was docked and passed through its electronic checks.  Initial attention was directed to the left retroperitoneum.  Incision was made lateral to the descending colon from the area of the hepatic flexure towards the area of the internal ring.  The colon was carefully swept medially and the lower pole of the kidney was identified and placed on gentle lateral traction.  Dissection proceeded directly medial to this.  The patient was noted to have  left double ureters.  These were placed in gentle lateral traction.  Dissection proceeded distally as the mass was somewhat central in location, there was some concern of possible transitional cell carcinoma.  As such ureters were dissected distally to the level of the iliac crossing.  At this point, the ureter was doubly clipped and ligated.  Dissection then proceeded superiorly within the triangle of the ureter psoas muscle, and retroperitoneum towards the area of the renal hilum.   Renal hilum was encountered and consisted of a single artery, 2 veins left renovascular anatomy.  There was significant matted lymph nodes in this area.  The area of the matted lymph nodes directly over the area of the aorta within the boundaries of the kidney was carefully dissected.  Lymphostasis was achieved with Hem-o-Lok clips.  This set aside for permanent pathology and labeled as left periaortic lymph nodes.  Hilum was then controlled using endovascular stapler.  The artery followed by endovascular stapler of the vein as separate staple loads.  This resulted in excellent hemostatic control of the renal hilum.  The adrenal gland appeared to be uninvolved grossly as such a spared.  The plane between the upper pole of the kidney and adrenal were carefully developed using bipolar energy, and the endovascular load stapler.  The lateral attachments were taken down.  This completely freed up the left radical nephrectomy.  Specimen was placed into a large EndoCatch bag. The area of the adrenal bed, retroperitoneum, and hilum once again inspected and hemostasis was found to be excellent.  All sponge and needle counts were correct.  Robot was then undocked.  Specimen was retrieved by extending the previous lateral most robotic port sites for total of 4 inches, dissecting down and excising the external fascia only.  The muscle was spread in this location, and the specimen was retrieved and set aside for permanent pathology.  The previous 12-mm camera port, assistant port sites were closed to the level of fascia using figure-of-eight Vicryl.  The retrieval site was closed by 1st reapproximating the transversalis and internal oblique musculature with running Vicryl.  The external oblique was controlled using figure-of- eight PDS x5.  Scarpa was reapproximated using running Vicryl.  All skin incisions were infiltrated with localized Marcaine and closed level of skin using subcuticular Monocryl  followed by Dermabond.  Procedure was then terminated.  The patient tolerated the procedure well.  There were no immediate periprocedural complications.  The patient was taken to Postanesthesia Care Unit in stable condition.          ______________________________ Sebastian Ache, MD     TM/MEDQ  D:  11/26/2012  T:  11/26/2012  Job:  [id number]

## 2012-11-26 NOTE — Preoperative (Signed)
Beta Blockers   Reason not to administer Beta Blockers:Not Applicable 

## 2012-11-26 NOTE — Progress Notes (Signed)
Dr. Leta Jungling made aware that patient's heart rate can be as low as 38- ; also made aware blood pressure is 105/56- to observe patient - per Dr. Quinn Axe  Order.

## 2012-11-26 NOTE — Progress Notes (Signed)
HGB. AND HCT. AND B MET RESULTS NOTED.

## 2012-11-26 NOTE — Transfer of Care (Signed)
Immediate Anesthesia Transfer of Care Note  Patient: Cristina Sanchez  Procedure(s) Performed: Procedure(s) (LRB): ROBOTIC ASSISTED LAPAROSCOPIC NEPHRECTOMY (Left)  Patient Location: PACU  Anesthesia Type: General  Level of Consciousness: sedated, patient cooperative and responds to stimulaton  Airway & Oxygen Therapy: Patient Spontanous Breathing and Patient connected to face mask oxgen  Post-op Assessment: Report given to PACU RN and Post -op Vital signs reviewed and stable  Post vital signs: Reviewed and stable  Complications: No apparent anesthesia complications

## 2012-11-26 NOTE — Progress Notes (Signed)
PHARMACIST - PHYSICIAN ORDER COMMUNICATION  CONCERNING: P&T Medication Policy on Herbal Medications  DESCRIPTION:  This patient's order for:  melatonin  has been noted.  This product(s) is classified as an "herbal" or natural product. Due to a lack of definitive safety studies or FDA approval, nonstandard manufacturing practices, plus the potential risk of unknown drug-drug interactions while on inpatient medications, the Pharmacy and Therapeutics Committee does not permit the use of "herbal" or natural products of this type within [facility].   ACTION TAKEN: The pharmacy department is unable to verify this order at this time and your patient has been informed of this safety policy. Please reevaluate patient's clinical condition at discharge and address if the herbal or natural product(s) should be resumed at that time.  Clance Boll, PharmD, BCPS Pager: [phone] 11/26/2012 1:24 PM

## 2012-11-26 NOTE — Progress Notes (Signed)
Patient is a member of [employer]. Her pastor is currently visiting with patient's spouse. Good spiritual and emotional support from [place].

## 2012-11-26 NOTE — H&P (Signed)
Cristina Sanchez is an 52 y.o. female.    Chief Complaint: Pre Op Left Robotic Radical Nephrectomy  HPI:   1 - Left Renal Mass - Pt with 5cm enhancing left hilar renal mass with central necrosis seen on CT-Hematuria protocol 11/2012. 1 artery / 2 vein renovascular anatomy. No additional intra-abdominal lesions. Admits to relatively new and worsening headaces as well. Cr 0.72. CXR and CT Head withotu additional lesions.  PMH sig for Fibromyalgia, GI Bleed, Benign hyst, cystocele / rectocele repair 2007 in [place].  Today Cristina Sanchez is seen to proceed with left radical nephrectomy. No interval fevers.  Past Medical History  Diagnosis Date  . Fibromyalgia   . Headache(784.0) migraines  . Arthritis R hip  . GERD (gastroesophageal reflux disease)   . Bursitis of left shoulder     hx of  . Rosacea   . Tietze syndrome   . Cancer     kidney cancer  . Gastric ulcer     hx of    Past Surgical History  Procedure Laterality Date  . D&c x2    . Diliation and coutar    . Cystocele repair  2007  . Abdominal hysterectomy  2009  . Rectocele repair  2007  . Dilation and curettage of uterus  1610,9604    x2  . C section  1997  . Esophagogastroduodenoscopy  03/24/2012    Procedure: ESOPHAGOGASTRODUODENOSCOPY (EGD);  Surgeon: Rachael Fee, MD;  Location: [facility];  Service: Endoscopy;  Laterality: N/A;    History reviewed. No pertinent family history. Social History:  reports that she has never smoked. She has never used smokeless tobacco. She reports that she does not drink alcohol or use illicit drugs.  Allergies:  Allergies  Allergen Reactions  . Sulfa Antibiotics Hives    dizzness    Medications Prior to Admission  Medication Sig Dispense Refill  . cyclobenzaprine (FLEXERIL) 10 MG tablet Take 10 mg by mouth 3 (three) times daily as needed. Muscle spasms      . gabapentin (NEURONTIN) 600 MG tablet Take 600 mg by mouth 2 (two) times daily.       . cholecalciferol (VITAMIN D) 1000  UNITS tablet Take 1,000 Units by mouth daily.      . Melatonin 3 MG TABS Take 3 mg by mouth at bedtime as needed (sleep).      . Menthol, Topical Analgesic, (BIOFREEZE EX) Apply 1 application topically 4 (four) times daily as needed (pain).      . Multiple Vitamin (MULTIVITAMIN WITH MINERALS) TABS Take 1 tablet by mouth daily.        No results found for this or any previous visit (from the past 48 hour(s)). No results found.  Review of Systems  Constitutional: Negative.  Negative for fever, chills, weight loss and malaise/fatigue.  Eyes: Negative.   Respiratory: Negative.   Cardiovascular: Negative.   Gastrointestinal: Negative.   Genitourinary: Negative.   Musculoskeletal: Negative.   Skin: Negative.   Neurological: Positive for headaches.  Endo/Heme/Allergies: Negative.   Psychiatric/Behavioral: Negative.     Blood pressure 119/76, pulse 75, temperature 97.4 F (36.3 C), temperature source Oral, resp. rate 18, SpO2 100.00%. Physical Exam  Constitutional: She is oriented to person, place, and time. She appears well-developed and well-nourished.  HENT:  Head: Normocephalic and atraumatic.  Eyes: EOM are normal. Pupils are equal, round, and reactive to light.  Neck: Normal range of motion. Neck supple.  Cardiovascular: Normal rate.   Respiratory: Effort normal and breath  sounds normal.  GI: Soft. Bowel sounds are normal.  Genitourinary:  No CVAT  Musculoskeletal: Normal range of motion.  Neurological: She is alert and oriented to person, place, and time.  Skin: Skin is warm and dry.  Psychiatric: She has a normal mood and affect. Her behavior is normal. Judgment and thought content normal.     Assessment/Plan   1 - Left Renal Mass - Clearly worrisome for renal cell carcinoma, transitional cell felt much less likely based on central necrosis and "pushing" effect on collecting system. Clinically localized based on staging imaging.  We rediscussed the role of radical  nephrectomy with the overall goal of complete surgical excision (negative margins) and better staging / diagnosis. We specifically rediscussed that with removal of the kidney there would be an overall renal function decline with attendant risks of renal failure and need for dialysis in some cases, and need for kidney-friendly lifestyle post-op with excellent blood pressure and glycemic control. We then rediscussed surgical approaches including robotic and open techniques with robotic associated with a shorter convalescence. I showed the patient on their abdomen the approximately 4-6 incision (trocar) sites as well as presumed extraction sites with robotic approach as well as possible open incision sites. We specifically readdressed that there may be need to alter operative plans according to intraopertive findings including conversion to open procedure. We discussed specific peri-operative risks including bleeding, infection, deep vein thrombosis, pulmonary embolism, compartment syndrome, nuropathy / neuropraxia, heart attack, stroke, death, as well as long-term risks such as non-cure / need for additional therapy and need for imaging and lab based post-op surveillance protocols. We rediscussed typical hospital course of approximately 2 day hospitalization, need for peri-operative drains / catheters, and typical post-hospital course with return to most non-strenuous activities by 2 weeks and ability to return to most jobs and more strenuous activity such as exercise by 6 weeks.   After this lengthy and detail discussion, including answering all of the patient's questions to their satisfaction, they have chosen to proceed.   Pax Reasoner 11/26/2012, 5:54 AM

## 2012-11-27 LAB — BASIC METABOLIC PANEL
BUN: 13 mg/dL (ref 6–23)
GFR calc non Af Amer: 57 mL/min — ABNORMAL LOW (ref >90)
Glucose, Bld: 136 mg/dL — ABNORMAL HIGH (ref 70–99)
Potassium: 4.6 mEq/L (ref 3.5–5.1)

## 2012-11-27 NOTE — Progress Notes (Signed)
The foley catheter was removed.  Patient tolerated this procedure well.

## 2012-11-27 NOTE — Progress Notes (Signed)
Urology Progress Note  Subjective:     No acute urologic events overnight. Negative ambulation. Pain controlled. More comfortable lying down. Tolerating regular diet. Negative flatus or BM. Catheter removed this morning; has not voided yet.  I explained the importance of walking and sitting up in a chair.  ROS: Negative: nausea or SOB.  Objective:  Patient Vitals for the past 24 hrs:  BP Temp Temp src Pulse Resp SpO2 Height Weight  11/27/12 0606 97/57 mmHg 97.6 F (36.4 C) Oral - 18 100 % - -  11/27/12 0155 93/58 mmHg 98.4 F (36.9 C) Oral 77 18 99 % - -  11/26/12 2139 107/63 mmHg 98.6 F (37 C) Oral 68 18 100 % - -  11/26/12 1900 115/66 mmHg 98 F (36.7 C) Oral 71 16 100 % - -  11/26/12 1321 - - - - - - 5\' 6"  (1.676 m) 60.38 kg (133 lb 1.8 oz)  11/26/12 1253 128/69 mmHg 97.3 F (36.3 C) - 56 16 100 % - -  11/26/12 1215 106/72 mmHg 97.5 F (36.4 C) - 43 14 100 % - -  11/26/12 1200 109/63 mmHg - - 46 20 100 % - -  11/26/12 1145 105/55 mmHg - - 42 22 100 % - -  11/26/12 1136 - - - 47 14 100 % - -  11/26/12 1130 114/62 mmHg 97.4 F (36.3 C) - 47 10 100 % - -  11/26/12 1115 113/67 mmHg 97.4 F (36.3 C) - 44 15 100 % - -  11/26/12 1113 - - - 45 11 100 % - -  11/26/12 1103 - - - 45 14 100 % - -  11/26/12 1100 111/72 mmHg - - 43 10 100 % - -  11/26/12 1045 105/56 mmHg - - 41 10 100 % - -  11/26/12 1040 - - - 44 11 100 % - -  11/26/12 1035 - - - 46 11 100 % - -  11/26/12 1030 102/77 mmHg - - 47 13 100 % - -  11/26/12 1015 131/74 mmHg - - 62 13 100 % - -    Physical Exam: General:  No acute distress, awake Cardiovascular:    [x]   S1/S2 present, RRR  []   Irregularly irregular Chest:  CTA-B Abdomen:               []  Soft, appropriately TTP  []  Soft, NTTP  [x]  Soft, appropriately TTP, incision(s) clean/dry/intact  Genitourinary: Negative foley catheter.     I/O last 3 completed shifts: In: 2430 [P.O.:240; I.V.:1890; IV Piggyback:300] Out: 2505 [Urine:2475;  Blood:30]  Recent Labs     11/26/12  1110  11/27/12  0435  HGB  12.9  10.5*    Recent Labs     11/26/12  1110  11/27/12  0435  NA  135  129*  K  4.3  4.6  CL  100  97  CO2  24  27  BUN  10  13  CREATININE  0.71  1.10  CALCIUM  8.8  8.4  GFRNONAA  >90  57*  GFRAA  >90  66*     No results found for this basename: PT, INR, APTT,  in the last 72 hours   No components found with this basename: ABG,     Length of stay: 1 days.  Assessment: Left renal mass. POD#1 Left radical nephrectomy.   Plan: -Ambulate and up in chair. -Continue bowel regiment. -Not ready for discharge home today. Possibly tomorrow.  Natalia Leatherwood, MD [phone]

## 2012-11-28 LAB — BASIC METABOLIC PANEL
BUN: 9 mg/dL (ref 6–23)
GFR calc non Af Amer: 65 mL/min — ABNORMAL LOW (ref >90)
Glucose, Bld: 127 mg/dL — ABNORMAL HIGH (ref 70–99)
Potassium: 4.7 mEq/L (ref 3.5–5.1)

## 2012-11-28 MED ORDER — OXYCODONE HCL 5 MG PO TABS
5.0000 mg | ORAL_TABLET | ORAL | Status: DC | PRN
  Administered 2012-11-28 – 2012-11-29 (×5): 10 mg via ORAL
  Filled 2012-11-28 (×5): qty 2

## 2012-11-28 MED ORDER — BISACODYL 10 MG RE SUPP
10.0000 mg | Freq: Two times a day (BID) | RECTAL | Status: DC
  Administered 2012-11-28 (×2): 10 mg via RECTAL
  Filled 2012-11-28 (×2): qty 1

## 2012-11-28 NOTE — Progress Notes (Signed)
Urology Progress Note  Subjective:     No acute urologic events overnight. Positive ambulation. She has not sat up in chair as recommended. Pain controlled with IV meds, but not PO medication. Tolerating regular diet. Negative flatus or BM. Voiding with ease.    ROS: Negative: nausea or SOB.  Objective:  Patient Vitals for the past 24 hrs:  BP Temp Temp src Pulse Resp SpO2  11/28/12 0512 100/65 mmHg 98.9 F (37.2 C) Oral 80 14 95 %  11/28/12 0203 - 98.9 F (37.2 C) Oral - - -  11/27/12 2141 108/72 mmHg 100.2 F (37.9 C) Oral 97 16 96 %  11/27/12 1511 105/73 mmHg 98.5 F (36.9 C) Oral 76 18 97 %    Physical Exam: General:  No acute distress, awake Cardiovascular:    [x]   S1/S2 present, RRR  []   Irregularly irregular Chest:  CTA-B Abdomen:               []  Soft, appropriately TTP  []  Soft, NTTP  [x]  Soft, appropriately TTP, incision(s) clean/dry/intact  Genitourinary: Negative foley catheter.     I/O last 3 completed shifts: In: 3611.3 [P.O.:840; I.V.:2571.3; IV Piggyback:200] Out: 6450 [Urine:6450]  Recent Labs     11/26/12  1110  11/27/12  0435  HGB  12.9  10.5*    Recent Labs     11/27/12  0435  11/28/12  0400  NA  129*  131*  K  4.6  4.7  CL  97  99  CO2  27  27  BUN  13  9  CREATININE  1.10  0.98  CALCIUM  8.4  8.8  GFRNONAA  57*  65*  GFRAA  66*  76*     No results found for this basename: PT, INR, APTT,  in the last 72 hours   No components found with this basename: ABG,     Length of stay: 2 days.  Assessment: Left renal mass. POD#2 Left radical nephrectomy.   Plan: -Saline lock IV -Add Dulcolax suppository BID. -Encouraged trying PO meds for pain control instead of IV. -Encourage up to chair and ambulation. - Likely discharge home in the morning.   Natalia Leatherwood, MD [phone]

## 2012-11-29 ENCOUNTER — Encounter (HOSPITAL_COMMUNITY): Payer: Self-pay | Admitting: Urology

## 2012-11-29 LAB — BASIC METABOLIC PANEL
BUN: 8 mg/dL (ref 6–23)
CO2: 30 mEq/L (ref 19–32)
Chloride: 99 mEq/L (ref 96–112)
Creatinine, Ser: 1.02 mg/dL (ref 0.50–1.10)
GFR calc Af Amer: 72 mL/min — ABNORMAL LOW (ref >90)
Glucose, Bld: 109 mg/dL — ABNORMAL HIGH (ref 70–99)

## 2012-11-29 MED ORDER — OXYCODONE-ACETAMINOPHEN 5-325 MG PO TABS: 1.0000 | ORAL_TABLET | ORAL | Status: DC | PRN

## 2012-11-29 MED ORDER — SENNA-DOCUSATE SODIUM 8.6-50 MG PO TABS: 1.0000 | ORAL_TABLET | Freq: Two times a day (BID) | ORAL | Status: DC

## 2012-11-29 NOTE — Discharge Summary (Signed)
Physician Discharge Summary  Patient ID: Cristina Sanchez MRN: [id number] DOB/AGE: [birth year] 52 y.o.  Admit date: 11/26/2012 Discharge date: 11/29/2012  Admission Diagnoses: Left Renal Neoplasm  Discharge Diagnoses: Left Renal Neoplasm   Discharged Condition: good  Hospital Course:   Pt underwent left robotic radical nephrectomy with periaortic lymphnode dissection on 11/26/12, the day of admission, without acute complications. She was admitted to the 4th floor urology service post op where she began her inpatient recovery. By POD3, the day of discharge, she was ambulatory, pain controlled on PO meds, maintaining diet with resumed bowel function, voiding, and felt to be adequate for discharge. Path pending at discharge. Discharge Cr <1.5.  Consults: None  Significant Diagnostic Studies: labs: Cr <1.5, Pathology pending  Treatments: surgery: left robotic radical nephrectomy with periaortic lymphnode dissection on 11/26/12  Discharge Exam: Blood pressure 107/64, pulse 93, temperature 98.8 F (37.1 C), temperature source Oral, resp. rate 12, height 5\' 6"  (1.676 m), weight 60.38 kg (133 lb 1.8 oz), SpO2 96.00%. General appearance: alert, cooperative and appears stated age Head: Normocephalic, without obvious abnormality, atraumatic Eyes: conjunctivae/corneas clear. PERRL, EOM's intact. Fundi benign. Ears: normal TM's and external ear canals both ears Nose: Nares normal. Septum midline. Mucosa normal. No drainage or sinus tenderness. Throat: lips, mucosa, and tongue normal; teeth and gums normal Neck: no adenopathy, no carotid bruit, no JVD, supple, symmetrical, trachea midline and thyroid not enlarged, symmetric, no tenderness/mass/nodules Back: symmetric, no curvature. ROM normal. No CVA tenderness. Resp: clear to auscultation bilaterally Chest wall: no tenderness Cardio: regular rate and rhythm, S1, S2 normal, no murmur, click, rub or gallop GI: soft, non-tender; bowel sounds normal;  no masses,  no organomegaly Extremities: extremities normal, atraumatic, no cyanosis or edema Pulses: 2+ and symmetric Skin: Skin color, texture, turgor normal. No rashes or lesions Lymph nodes: Cervical, supraclavicular, and axillary nodes normal. Neurologic: Grossly normal Incision/Wound: Recent port sites and extraction sites c/d/i. No hernias / hematoma.  Disposition: 01-Home or Self Care     Medication List    STOP taking these medications       cholecalciferol 1000 UNITS tablet  Commonly known as:  VITAMIN D     Melatonin 3 MG Tabs     multivitamin with minerals Tabs      TAKE these medications       BIOFREEZE EX  Apply 1 application topically 4 (four) times daily as needed (pain).     cyclobenzaprine 10 MG tablet  Commonly known as:  FLEXERIL  Take 10 mg by mouth 3 (three) times daily as needed. Muscle spasms     gabapentin 600 MG tablet  Commonly known as:  NEURONTIN  Take 600 mg by mouth 2 (two) times daily.           Follow-up Information   Follow up with Sebastian Ache, MD On 12/13/2012. (at 10:30)    Contact information:   [address] [place] [address] [phone]       Signed: Sebastian Ache 11/29/2012, 6:33 AM

## 2012-11-29 NOTE — Progress Notes (Signed)
Discharge summary sent to payer through [facility]  

## 2013-03-08 ENCOUNTER — Encounter: Payer: Self-pay | Admitting: Neurology

## 2013-03-11 ENCOUNTER — Encounter: Payer: Self-pay | Admitting: Neurology

## 2013-03-11 ENCOUNTER — Ambulatory Visit (INDEPENDENT_AMBULATORY_CARE_PROVIDER_SITE_OTHER): Payer: BC Managed Care – PPO | Admitting: Neurology

## 2013-03-11 VITALS — BP 117/71 | HR 78 | Ht 66.0 in | Wt 131.0 lb

## 2013-03-11 DIAGNOSIS — G43909 Migraine, unspecified, not intractable, without status migrainosus: Secondary | ICD-10-CM

## 2013-03-11 MED ORDER — RIZATRIPTAN BENZOATE 10 MG PO TBDP: 10.0000 mg | ORAL_TABLET | ORAL | Status: DC | PRN

## 2013-03-11 MED ORDER — GABAPENTIN 600 MG PO TABS: 600.0000 mg | ORAL_TABLET | Freq: Three times a day (TID) | ORAL | Status: DC

## 2013-03-11 MED ORDER — CYCLOBENZAPRINE HCL 10 MG PO TABS: 10.0000 mg | ORAL_TABLET | Freq: Three times a day (TID) | ORAL | Status: DC | PRN

## 2013-03-11 NOTE — Progress Notes (Signed)
[facility]  PATIENT: Cristina Sanchez DOB: Nov 06, 1960  HISTORICAL  Cristina Sanchez  is a 52 years old right-handed Caucasian female, referred by her primary care physician Dr. Denyse Amass for evaluation of migraine headaches, and body aching pain  She had past medical history of fibromyalgia, reported a history of migraine headaches since 1983, her typical migraine started with silver lining in her visual field, and then tunneled vision, lasting for 25 minutes, followed by severe pounding headache was associated light noise sensitivity, worsening by movement, she has to lie in bed one day, sometimes even 3 days because of her severe migraines.  Trigger for her migraines are cheesecakes, caffeine, chocolate, weather change, stress,  Over the past several years, her migraine has much improved, she is also taking gabapentin 600 mg 3 times a day for her diffuse body aching pain, bilateral knee pain, which has been very helpful  She used to take ibuprofen as needed for headaches, but is no longer taking it   REVIEW OF SYSTEMS: Full 14 system review of systems performed and notable only for body aching pain,  ALLERGIES: Allergies  Allergen Reactions  . Nsaids   . Sulfa Antibiotics Hives    dizzness    HOME MEDICATIONS: Outpatient Prescriptions Prior to Visit  Medication Sig Dispense Refill  . cyclobenzaprine (FLEXERIL) 10 MG tablet Take 10 mg by mouth 3 (three) times daily as needed. Muscle spasms      . gabapentin (NEURONTIN) 600 MG tablet Take 600 mg by mouth 3 times daily.       . Menthol, Topical Analgesic, (BIOFREEZE EX) Apply 1 application topically 4 (four) times daily as needed (pain).      . Multiple Vitamins-Minerals (MULTIVITAMIN PO) Take by mouth daily.       No facility-administered medications prior to visit.    PAST MEDICAL HISTORY: Past Medical History  Diagnosis Date  . Fibromyalgia   . Headache(784.0) migraines  . Arthritis R hip  . GERD  (gastroesophageal reflux disease)   . Bursitis of left shoulder     hx of  . Rosacea   . Tietze syndrome   . Gastric ulcer 2013   . Migraines   . Hearing loss     Right   . Chest pain   . Vitamin D deficiency     PAST SURGICAL HISTORY: Past Surgical History  Procedure Laterality Date  . D&c x2    . Diliation and coutar    . Cystocele repair  2007  . Abdominal hysterectomy  2009  . Rectocele repair  2007  . Dilation and curettage of uterus  [id number]    x2  . C section  1997  . Esophagogastroduodenoscopy  03/24/2012    Procedure: ESOPHAGOGASTRODUODENOSCOPY (EGD);  Surgeon: Rachael Fee, MD;  Location: [facility];  Service: Endoscopy;  Laterality: N/A;  . Robot assisted laparoscopic nephrectomy Left 11/26/2012    Procedure: ROBOTIC ASSISTED LAPAROSCOPIC NEPHRECTOMY;  Surgeon: Sebastian Ache, MD;  Location: [facility];  Service: Urology;  Laterality: Left;    FAMILY HISTORY: Family History  Problem Relation Age of Onset  . Dementia Mother     SOCIAL HISTORY:  History   Social History  . Marital Status: Married    Spouse Name: martin    Number of Children: 3  . 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 Smoking status: Never Smoker   . Smokeless tobacco: Never Used  .  Alcohol Use: Yes     Comment: once or twice a year   . Drug Use: No  . Sexual Activity: Not on file    Social History Narrative   Patient lives at home with her husband.      PHYSICAL EXAM   Filed Vitals:   03/11/13 0748  BP: 117/71  Pulse: 78  Height: 5\' 6"  (1.676 m)  Weight: 131 lb (59.421 kg)    Body mass index is 21.15 kg/(m^2).   Generalized: In no acute distress  Neck: Supple, no carotid bruits   Cardiac: Regular rate rhythm  Pulmonary: Clear to auscultation bilaterally  Musculoskeletal: No deformity  Neurological examination  Mentation: Alert oriented to time, place, history taking, and causual  conversation  Cranial nerve II-XII: Pupils were equal round reactive to light extraocular movements were full, visual field were full on confrontational test. facial sensation and strength were normal. hearing was intact to finger rubbing bilaterally. Uvula tongue midline.  head turning and shoulder shrug and were normal and symmetric.Tongue protrusion into cheek strength was normal.  Motor: normal tone, bulk and strength.  Sensory: Intact to fine touch, pinprick, preserved vibratory sensation, and proprioception at toes.  Coordination: Normal finger to nose, heel-to-shin bilaterally there was no truncal ataxia  Gait: Rising up from seated position without assistance, normal stance, without trunk ataxia, moderate stride, good arm swing, smooth turning, able to perform tiptoe, and heel walking without difficulty.   Romberg signs: Negative  Deep tendon reflexes: Brachioradialis 2/2, biceps 2/2, triceps 2/2, patellar 2/2, Achilles 2/2, plantar responses were flexor bilaterally.   DIAGNOSTIC DATA (LABS, IMAGING, TESTING) - I reviewed patient records, labs, notes, testing and imaging myself where available.  Lab Results  Component Value Date   WBC 5.0 11/22/2012   HGB 10.5* 11/27/2012   HCT 30.7* 11/27/2012   MCV 91.6 11/22/2012   PLT 330 11/22/2012      Component Value Date/Time   NA 133* 11/29/2012 0430   K 4.3 11/29/2012 0430   CL 99 11/29/2012 0430   CO2 30 11/29/2012 0430   GLUCOSE 109* 11/29/2012 0430   BUN 8 11/29/2012 0430   CREATININE 1.02 11/29/2012 0430   CALCIUM 9.1 11/29/2012 0430   PROT 6.2 03/23/2012 2010   ALBUMIN 3.4* 03/23/2012 2010   AST 14 03/23/2012 2010   ALT 12 03/23/2012 2010   ALKPHOS 44 03/23/2012 2010   BILITOT 0.3 03/23/2012 2010   GFRNONAA 62* 11/29/2012 0430   GFRAA 72* 11/29/2012 0430     ASSESSMENT AND PLAN   52 years old Caucasian female, with history of migraine headaches, much improved now, but still has 3-4 migraines each month, moderately severe,  disabling  1. keep gabapentin 600 mg 3 times a day 2 Maxalt as needed for migraine abortion, 3, return to clinic with Eber Jones in 3-4 months      Levert Feinstein, M.D. Ph.D.  [facility] [address], [address] [place], [place] [address] [phone]

## 2013-07-11 ENCOUNTER — Ambulatory Visit: Payer: BC Managed Care – PPO | Admitting: Nurse Practitioner

## 2013-10-06 ENCOUNTER — Encounter (INDEPENDENT_AMBULATORY_CARE_PROVIDER_SITE_OTHER): Payer: Self-pay

## 2013-10-06 ENCOUNTER — Encounter: Payer: Self-pay | Admitting: Nurse Practitioner

## 2013-10-06 ENCOUNTER — Ambulatory Visit (INDEPENDENT_AMBULATORY_CARE_PROVIDER_SITE_OTHER): Payer: BC Managed Care – PPO | Admitting: Nurse Practitioner

## 2013-10-06 VITALS — BP 98/63 | HR 78 | Ht 66.5 in | Wt 146.0 lb

## 2013-10-06 DIAGNOSIS — G43909 Migraine, unspecified, not intractable, without status migrainosus: Secondary | ICD-10-CM

## 2013-10-06 MED ORDER — RIZATRIPTAN BENZOATE 10 MG PO TBDP: 10.0000 mg | ORAL_TABLET | ORAL | Status: DC | PRN

## 2013-10-06 NOTE — Progress Notes (Signed)
[facility]  PATIENT: Cristina Sanchez DOB: 1960/09/18   REASON FOR VISIT: Followup for migraine   HISTORY OF PRESENT ILLNESS: Cristina Sanchez, 53 year old female returns for followup. She was initially evaluated for migraines by Dr. Krista Blue 03/11/2013. She is currently on gabapentin which has helped her headaches as well as her body aches and pains. Maxalt works acutely. Her frequency of headaches changes from month to month, there is fairly good control at this time. Her biggest  trigger for headaches are weather changes and stress. She returns for reevaluation   HISTORY: migraine headaches, and body aching pain  She had past medical history of fibromyalgia, reported a history of migraine headaches since 1983, her typical migraine started with silver lining in her visual field, and then tunneled vision, lasting for 25 minutes, followed by severe pounding headache was associated light noise sensitivity, worsening by movement, she has to lie in bed one day, sometimes even 3 days because of her severe migraines.  Trigger for her migraines are cheesecakes, caffeine, chocolate, weather change, stress,  Over the past several years, her migraine has much improved, she is also taking gabapentin 600 mg 3 times a day for her diffuse body aching pain, bilateral knee pain, which has been very helpful  She used to take ibuprofen as needed for headaches, but is no longer taking it   REVIEW OF SYSTEMS: Full 14 system review of systems performed and notable only for those listed, all others are neg:  Constitutional: Fatigue  Cardiovascular: N/A  Ear/Nose/Throat: N/A  Skin: N/A  Eyes: N/A  Respiratory: N/A  Gastroitestinal: N/A  Hematology/Lymphatic: N/A  Endocrine: N/A Musculoskeletal: Joint pain, neck pain Allergy/Immunology: N/A  Neurological: N/A Psychiatric: N/A   ALLERGIES: Allergies  Allergen Reactions  . Nsaids   . Sulfa Antibiotics Hives    dizzness    HOME  MEDICATIONS: Outpatient Prescriptions Prior to Visit  Medication Sig Dispense Refill  . cyclobenzaprine (FLEXERIL) 10 MG tablet Take 1 tablet (10 mg total) by mouth 3 (three) times daily as needed. Muscle spasms  30 tablet  6  . gabapentin (NEURONTIN) 600 MG tablet Take 1 tablet (600 mg total) by mouth 3 (three) times daily.  90 tablet  12  . Menthol, Topical Analgesic, (BIOFREEZE EX) Apply 1 application topically 4 (four) times daily as needed (pain).      . Multiple Vitamins-Minerals (MULTIVITAMIN PO) Take by mouth daily.      . rizatriptan (MAXALT-MLT) 10 MG disintegrating tablet Take 1 tablet (10 mg total) by mouth as needed for migraine. May repeat in 2 hours if needed  15 tablet  6   No facility-administered medications prior to visit.    PAST MEDICAL HISTORY: Past Medical History  Diagnosis Date  . Fibromyalgia   . Headache(784.0) migraines  . Arthritis R hip  . GERD (gastroesophageal reflux disease)   . Bursitis of left shoulder     hx of  . Rosacea   . Tietze syndrome   . Cancer     kidney cancer  . Gastric ulcer     hx of  . Migraines   . Hearing loss     Right   . Chest pain   . Vitamin D deficiency     PAST SURGICAL HISTORY: Past Surgical History  Procedure Laterality Date  . D&c x2    . Diliation and coutar    . Cystocele repair  2007  . Abdominal hysterectomy  2009  . Rectocele repair  2007  .  Dilation and curettage of uterus  [id number]    x2  . C section  1997  . Esophagogastroduodenoscopy  03/24/2012    Procedure: ESOPHAGOGASTRODUODENOSCOPY (EGD);  Surgeon: Milus Banister, MD;  Location: [facility];  Service: Endoscopy;  Laterality: N/A;  . Robot assisted laparoscopic nephrectomy Left 11/26/2012    Procedure: ROBOTIC ASSISTED LAPAROSCOPIC NEPHRECTOMY;  Surgeon: Alexis Frock, MD;  Location: [facility];  Service: Urology;  Laterality: Left;    FAMILY HISTORY: Family History  Problem Relation Age of Onset  . Dementia Mother     SOCIAL  HISTORY: History   Social History  . Marital Status: Married    Spouse Name: Athleen Feltner    Number of Children: 3  . Years of Education: college   Occupational History  .      Home maker   Social History Main Topics  . Smoking status: Never Smoker   . Smokeless tobacco: Never Used  . Alcohol Use: Yes     Comment: once or twice a year   . Drug Use: No  . Sexual Activity: Not on file   Other Topics Concern  . Not on file   Social History Narrative   Patient lives at home with her husband.    Patient has 3 children.    Patient is currently a homemaker.    Patient is right handed.            PHYSICAL EXAM  Filed Vitals:   10/06/13 0906  BP: 98/63  Pulse: 78  Height: 5' 6.5" (1.689 m)  Weight: 146 lb (66.225 kg)   Body mass index is 23.21 kg/(m^2).  Generalized: Well developed, in no acute distress  Head: normocephalic and atraumatic,. Oropharynx benign  Neck: Supple, no carotid bruits  Cardiac: Regular rate rhythm, no murmur  Musculoskeletal: No deformity   Neurological examination   Mentation: Alert oriented to time, place, history taking. Follows all commands speech and language fluent  Cranial nerve II-XII: .Pupils were equal round reactive to light extraocular movements were full, visual field were full on confrontational test. Facial sensation and strength were normal. hearing was intact to finger rubbing bilaterally. Uvula tongue midline. head turning and shoulder shrug were normal and symmetric.Tongue protrusion into cheek strength was normal. Motor: normal bulk and tone, full strength in the BUE, BLE, fine finger movements normal, no pronator drift. No focal weakness Sensory: normal and symmetric to light touch, pinprick, and  vibration lower extremities Coordination: finger-nose-finger, heel-to-shin bilaterally, no dysmetria Reflexes: Brachioradialis 2/2, biceps 2/2, triceps 2/2, patellar 2/2, Achilles 2/2, plantar responses were flexor bilaterally. Gait  and Station: Rising up from seated position without assistance, normal stance,  moderate stride, good arm swing, smooth turning, able to perform tiptoe, and heel walking without difficulty. Tandem gait is steady  DIAGNOSTIC DATA (LABS, IMAGING, TESTING) -  ASSESSMENT AND PLAN  53 y.o. year old female  has a past medical history of Fibromyalgia; Headache(784.0) (migraines); Arthritis (R hip); GERD (gastroesophageal reflux disease);  Gastric ulcer; Migraines; here to followup for her migraines which are in fairly good control at present. Maxalt works acutely  Patient to continue gabapentin at the current dose, does not need refills Continue Maxalt acutely for headaches, will refill Followup in 6 months If headaches worsen keep  headache diary She was given general information on stress reduction importance of keeping a headache diary common triggers for migraine etc. Dennie Bible, [facility], [facility], APRN  [facility] [address] [place], [place] [address] [phone])  [phone]  

## 2013-10-06 NOTE — Patient Instructions (Signed)
Patient to continue gabapentin at the current dose, does not need refills Continue Maxalt acutely for headaches, will refill Followup in 6 months If headaches worsen keep  headache diary

## 2014-01-01 ENCOUNTER — Other Ambulatory Visit: Payer: Self-pay

## 2014-01-01 MED ORDER — GABAPENTIN 600 MG PO TABS: 600.0000 mg | ORAL_TABLET | Freq: Three times a day (TID) | ORAL | Status: DC

## 2014-01-01 MED ORDER — RIZATRIPTAN BENZOATE 10 MG PO TBDP: 10.0000 mg | ORAL_TABLET | ORAL | Status: DC | PRN

## 2014-03-14 ENCOUNTER — Other Ambulatory Visit: Payer: Self-pay | Admitting: Sports Medicine

## 2014-03-14 DIAGNOSIS — M25511 Pain in right shoulder: Secondary | ICD-10-CM

## 2014-03-17 ENCOUNTER — Other Ambulatory Visit: Payer: Self-pay | Admitting: Sports Medicine

## 2014-03-17 DIAGNOSIS — M25511 Pain in right shoulder: Secondary | ICD-10-CM

## 2014-03-20 ENCOUNTER — Other Ambulatory Visit: Payer: BC Managed Care – PPO

## 2014-04-03 ENCOUNTER — Ambulatory Visit
Admission: RE | Admit: 2014-04-03 | Discharge: 2014-04-03 | Disposition: A | Discharge status: Home / Self Care | Payer: BC Managed Care – PPO | Source: Ambulatory Visit | Attending: Sports Medicine | Admitting: Sports Medicine

## 2014-04-03 DIAGNOSIS — M25511 Pain in right shoulder: Secondary | ICD-10-CM

## 2014-04-03 MED ORDER — IOHEXOL 180 MG/ML  SOLN: 5.0000 mL | Freq: Once | INTRAMUSCULAR | Status: DC | PRN

## 2014-04-05 ENCOUNTER — Ambulatory Visit (INDEPENDENT_AMBULATORY_CARE_PROVIDER_SITE_OTHER): Payer: BC Managed Care – PPO | Admitting: Adult Health

## 2014-04-05 ENCOUNTER — Encounter: Payer: Self-pay | Admitting: Adult Health

## 2014-04-05 VITALS — BP 102/73 | HR 72 | Ht 67.0 in | Wt 147.0 lb

## 2014-04-05 DIAGNOSIS — G43009 Migraine without aura, not intractable, without status migrainosus: Secondary | ICD-10-CM

## 2014-04-05 NOTE — Patient Instructions (Signed)

## 2014-04-05 NOTE — Progress Notes (Signed)
PATIENT: Cristina Sanchez DOB: Mar 27, 1961  REASON FOR VISIT: follow up HISTORY FROM: patient  HISTORY OF PRESENT ILLNESS: Cristina Sanchez is a 53 year old female with a history of migraines. She returns today for follow-up. She is currently taking gabapentin and maxalt. She reports that her headaches have improved over the years. She states that she no longer has severe headaches in over a year. She may have a dull headache several times a month. The weather and stress are triggers for her headaches. There are certain food such as cheese that she has given up as well as coffee. She states the only new neurological complaint is losing her balance. She denies falling but has stumbled into things. She states that this started within the past year. She has had ongoing dizziness was evaluated by ENT several years ago.  She states that her balance and dizziness usually do not correlate. She is on Vicodin for recent shoulder pain. Denies any numbness or tingling in the extremities. No changes with bowels or bladder.   HISTORY 10/06/13 (CM): Cristina Sanchez, 53 year old female returns for followup. She was initially evaluated for migraines by Dr. Krista Blue 03/11/2013. She is currently on gabapentin which has helped her headaches as well as her body aches and pains. Maxalt works acutely. Her frequency of headaches changes from month to month, there is fairly good control at this time. Her biggest trigger for headaches are weather changes and stress. She returns for reevaluation  HISTORY: migraine headaches, and body aching pain She had past medical history of fibromyalgia, reported a history of migraine headaches since 1983, her typical migraine started with silver lining in her visual field, and then tunneled vision, lasting for 25 minutes, followed by severe pounding headache was associated light noise sensitivity, worsening by movement, she has to lie in bed one day, sometimes even 3 days because of her severe migraines.  Trigger for her migraines are cheesecakes, caffeine, chocolate, weather change, stress, Over the past several years, her migraine has much improved, she is also taking gabapentin 600 mg 3 times a day for her diffuse body aching pain, bilateral knee pain, which has been very helpful She used to take ibuprofen as needed for headaches, but is no longer taking it   REVIEW OF SYSTEMS: Full 14 system review of systems performed and notable only for:  Constitutional: Activity change, fatigue Eyes: Blurred vision Ear/Nose/Throat: Hearing loss, ringing in ears Skin: N/A  Cardiovascular: N/A  Respiratory: N/A  Gastrointestinal: Nausea Genitourinary: N/A Hematology/Lymphatic: N/A  Endocrine: N/A Musculoskeletal: Joint pain, joint swelling, aching muscles, neck pain, neck stiffness Allergy/Immunology: N/A  Neurological: Dizziness, headache, weakness Psychiatric: N/A Sleep: Frequent waking, daytime sleepiness   ALLERGIES: Allergies  Allergen Reactions  . Nsaids   . Sulfa Antibiotics Hives    dizzness    HOME MEDICATIONS: Outpatient Prescriptions Prior to Visit  Medication Sig Dispense Refill  . cyclobenzaprine (FLEXERIL) 10 MG tablet Take 1 tablet (10 mg total) by mouth 3 (three) times daily as needed. Muscle spasms  30 tablet  6  . gabapentin (NEURONTIN) 600 MG tablet Take 1 tablet (600 mg total) by mouth 3 (three) times daily.  270 tablet  1  . Menthol, Topical Analgesic, (BIOFREEZE EX) Apply 1 application topically 4 (four) times daily as needed (pain).      . Multiple Vitamins-Minerals (MULTIVITAMIN PO) Take by mouth daily.      . rizatriptan (MAXALT-MLT) 10 MG disintegrating tablet Take 1 tablet (10 mg total) by mouth as needed  for migraine. May repeat in 2 hours if needed  36 tablet  1   No facility-administered medications prior to visit.    PAST MEDICAL HISTORY: Past Medical History  Diagnosis Date  . Fibromyalgia   . Headache(784.0) migraines  . Arthritis R hip  . GERD  (gastroesophageal reflux disease)   . Bursitis of left shoulder     hx of  . Rosacea   . Tietze syndrome   . Cancer     kidney cancer  . Gastric ulcer     hx of  . Migraines   . Hearing loss     Right   . Chest pain   . Vitamin D deficiency     PAST SURGICAL HISTORY: Past Surgical History  Procedure Laterality Date  . D&c x2    . Diliation and coutar    . Cystocele repair  2007  . Abdominal hysterectomy  2009  . Rectocele repair  2007  . Dilation and curettage of uterus  [id number]    x2  . C section  1997  . Esophagogastroduodenoscopy  03/24/2012    Procedure: ESOPHAGOGASTRODUODENOSCOPY (EGD);  Surgeon: Milus Banister, MD;  Location: [facility];  Service: Endoscopy;  Laterality: N/A;  . Robot assisted laparoscopic nephrectomy Left 11/26/2012    Procedure: ROBOTIC ASSISTED LAPAROSCOPIC NEPHRECTOMY;  Surgeon: Alexis Frock, MD;  Location: [facility];  Service: Urology;  Laterality: Left;    FAMILY HISTORY: Family History  Problem Relation Age of Onset  . Dementia Mother     SOCIAL HISTORY: History   Social History  . Marital Status: Married    Spouse Name: martin    Number of Children: 3  . Years of Education: college   Occupational History  .      Home maker   Social History Main Topics  . Smoking status: Never Smoker   . Smokeless tobacco: Never Used  . Alcohol Use: Yes     Comment: once or twice a year   . Drug Use: No  . Sexual Activity: Not on file   Other Topics Concern  . Not on file   Social History Narrative   Patient lives at home with her husband.    Patient has 3 children.    Patient is currently a homemaker.    Patient is right handed.             PHYSICAL EXAM  Filed Vitals:   04/05/14 1426  BP: 102/73  Pulse: 72  Height: 5\' 7"  (1.702 m)  Weight: 147 lb (66.679 kg)   Body mass index is 23.02 kg/(m^2).  Generalized: Well developed, in no acute distress   Neurological examination  Mentation: Alert oriented to time,  place, history taking. Follows all commands speech and language fluent Cranial nerve II-XII: Pupils were equal round reactive to light. Extraocular movements were full, visual field were full on confrontational test. Facial sensation and strength were normal. hearing was intact to finger rubbing bilaterally. Uvula tongue midline. Head turning and shoulder shrug  were normal and symmetric.T Motor: The motor testing reveals 5 over 5 strength of all 4 extremities. Good symmetric motor tone is noted throughout.  Sensory: Sensory testing is intact to soft touch on all 4 extremities. No evidence of extinction is noted.  Coordination: Cerebellar testing reveals good finger-nose-finger and heel-to-shin bilaterally.  Gait and station: Gait is normal. Tandem gait is normal. Walking on toes and heels are normal. Romberg is negative. No drift is seen.  Reflexes: Deep tendon  reflexes are symmetric and normal bilaterally.    DIAGNOSTIC DATA (LABS, IMAGING, TESTING) - I reviewed patient records, labs, notes, testing and imaging myself where available.  Lab Results  Component Value Date   WBC 5.0 11/22/2012   HGB 10.5* 11/27/2012   HCT 30.7* 11/27/2012   MCV 91.6 11/22/2012   PLT 330 11/22/2012      Component Value Date/Time   NA 133* 11/29/2012 0430   K 4.3 11/29/2012 0430   CL 99 11/29/2012 0430   CO2 30 11/29/2012 0430   GLUCOSE 109* 11/29/2012 0430   BUN 8 11/29/2012 0430   CREATININE 1.02 11/29/2012 0430   CALCIUM 9.1 11/29/2012 0430   PROT 6.2 03/23/2012 2010   ALBUMIN 3.4* 03/23/2012 2010   AST 14 03/23/2012 2010   ALT 12 03/23/2012 2010   ALKPHOS 44 03/23/2012 2010   BILITOT 0.3 03/23/2012 2010   GFRNONAA 62* 11/29/2012 0430   GFRAA 72* 11/29/2012 0430    ASSESSMENT AND PLAN 53 y.o. year old female  has a past medical history of Fibromyalgia; Headache(784.0) (migraines); Arthritis (R hip); GERD (gastroesophageal reflux disease); Bursitis of left shoulder; Rosacea; Tietze syndrome; Cancer; Gastric  ulcer; Migraines; Hearing loss; Chest pain; and Vitamin D deficiency. here with:  1. Migraines  Patient headaches have been controlled with gabapentin. Continue gabapentin, I will refill this today. Patient complains of balance issues as well as dizziness. The patient has not been evaluated by her PCP. Her exam is unremarkable. At this time she is not interested in having any type of scan is completed. Advised patient that she should talk to her primary care provider about the symptoms if they should worsen. She will followup with [place] in 6 months or sooner if needed.  Ward Givens, MSN, NP-C 04/05/2014, 2:28 PM [facility] [address] [place], [place] [address] [phone]  Note: This document was prepared with digital dictation and possible smart phrase technology. Any transcriptional errors that result from this process are unintentional.

## 2014-04-07 ENCOUNTER — Ambulatory Visit: Payer: BC Managed Care – PPO | Admitting: Nurse Practitioner

## 2014-09-22 ENCOUNTER — Encounter: Payer: Self-pay | Admitting: Neurology

## 2014-09-22 ENCOUNTER — Ambulatory Visit (INDEPENDENT_AMBULATORY_CARE_PROVIDER_SITE_OTHER): Payer: BLUE CROSS/BLUE SHIELD | Admitting: Neurology

## 2014-09-22 ENCOUNTER — Ambulatory Visit: Payer: BC Managed Care – PPO | Admitting: Adult Health

## 2014-09-22 VITALS — BP 116/82 | HR 72 | Ht 67.0 in | Wt 146.0 lb

## 2014-09-22 DIAGNOSIS — G43009 Migraine without aura, not intractable, without status migrainosus: Secondary | ICD-10-CM

## 2014-09-22 MED ORDER — RIZATRIPTAN BENZOATE 10 MG PO TBDP: 10.0000 mg | ORAL_TABLET | ORAL | Status: DC | PRN

## 2014-09-22 NOTE — Progress Notes (Signed)
PATIENT: Cristina Sanchez DOB: [birth year]  REASON FOR VISIT: follow up HISTORY FROM: patient  HISTORY OF PRESENT ILLNESS: Initial evaluation October 2014,   She had past medical history of fibromyalgia, reported a history of migraine headaches since 1983, her typical migraine started with silver lining in her visual field, and then tunneled vision, lasting for 25 minutes, followed by severe pounding headache was associated light noise sensitivity, worsening by movement, she has to lie in bed one day, sometimes even 3 days because of her severe migraines. Trigger for her migraines are cheesecakes, caffeine, chocolate, weather change, stress, Over the past several years, her migraine has much improved, she is also taking gabapentin 600 mg 3 times a day for her diffuse body aching pain, bilateral knee pain, which has been very helpful She used to take ibuprofen as needed for headaches, but is no longer taking it   UPDATE April 15th 2016:   She is overall happy with her current migraine control, she has one to 3 typical migraines over one month, require Maxalt treatment, which has been very effective, otherwise, she has mild to moderate lateralized pounding headaches 2-3 times a day, lasting for a few hours, she is not taking any medication for that. She continues to take gabapentin 300 mg 3 times a day, which has helped her headache, and also fibromyalgia 1-3 /month,    REVIEW OF SYSTEMS: Full 14 system review of systems performed and notable only for: As above     ALLERGIES: Allergies  Allergen Reactions  . Nsaids   . Sulfa Antibiotics Hives    dizzness    HOME MEDICATIONS: Outpatient Prescriptions Prior to Visit  Medication Sig Dispense Refill  . cyclobenzaprine (FLEXERIL) 10 MG tablet Take 1 tablet (10 mg total) by mouth 3 (three) times daily as needed. Muscle spasms 30 tablet 6  . gabapentin (NEURONTIN) 600 MG tablet Take 1 tablet (600 mg total) by mouth 3 (three) times daily.  270 tablet 1  . HYDROcodone-acetaminophen (NORCO/VICODIN) 5-325 MG per tablet as needed.    . Multiple Vitamins-Minerals (MULTIVITAMIN PO) Take by mouth daily.    . rizatriptan (MAXALT-MLT) 10 MG disintegrating tablet Take 1 tablet (10 mg total) by mouth as needed for migraine. May repeat in 2 hours if needed 36 tablet 1  . Diclofenac Sodium (PENNSAID TD) Place onto the skin.    . Menthol, Topical Analgesic, (BIOFREEZE EX) Apply 1 application topically 4 (four) times daily as needed (pain).     No facility-administered medications prior to visit.    PAST MEDICAL HISTORY: Past Medical History  Diagnosis Date  . Fibromyalgia   . Headache(784.0) migraines  . Arthritis R hip  . GERD (gastroesophageal reflux disease)   . Bursitis of left shoulder     hx of  . Rosacea   . Tietze syndrome   . Cancer     kidney cancer  . Gastric ulcer     hx of  . Migraines   . Hearing loss     Right   . Chest pain   . Vitamin D deficiency     PAST SURGICAL HISTORY: Past Surgical History  Procedure Laterality Date  . D&c x2    . Diliation and coutar    . Cystocele repair  2007  . Abdominal hysterectomy  2009  . Rectocele repair  2007  . Dilation and curettage of uterus  [id number]    x2  . C section  1997  . Esophagogastroduodenoscopy  03/24/2012  Procedure: ESOPHAGOGASTRODUODENOSCOPY (EGD);  Surgeon: Milus Banister, MD;  Location: [facility];  Service: Endoscopy;  Laterality: N/A;  . Robot assisted laparoscopic nephrectomy Left 11/26/2012    Procedure: ROBOTIC ASSISTED LAPAROSCOPIC NEPHRECTOMY;  Surgeon: Alexis Frock, MD;  Location: [facility];  Service: Urology;  Laterality: Left;    FAMILY HISTORY: Family History  Problem Relation Age of Onset  . Dementia Mother     SOCIAL HISTORY: History   Social History  . Marital Status: Married    Spouse Name: Hassell Done  . Number of Children: 3  . Years of Education: college   Occupational History  .      Home maker   Social History  Main Topics  . Smoking status: Never Smoker   . Smokeless tobacco: Never Used  . Alcohol Use: Yes     Comment: once or twice a year   . Drug Use: No  . Sexual Activity: Not on file   Other Topics Concern  . Not on file   Social History Narrative   Patient lives at home with her husband.    Patient has 3 children.    Patient is currently a homemaker.    Patient is right handed.             PHYSICAL EXAM  Filed Vitals:   09/22/14 1021  BP: 116/82  Pulse: 72  Height: 5\' 7"  (1.702 m)  Weight: 146 lb (66.225 kg)   Body mass index is 22.86 kg/(m^2).  PHYSICAL EXAMNIATION:  Gen: NAD, conversant, well nourised, obese, well groomed                     Cardiovascular: Regular rate rhythm, no peripheral edema, warm, nontender. Eyes: Conjunctivae clear without exudates or hemorrhage Neck: Supple, no carotid bruise. Pulmonary: Clear to auscultation bilaterally   NEUROLOGICAL EXAM:  MENTAL STATUS: Speech:    Speech is normal; fluent and spontaneous with normal comprehension.  Cognition:    The patient is oriented to person, place, and time;     recent and remote memory intact;     language fluent;     normal attention, concentration,     fund of knowledge.  CRANIAL NERVES: CN II: Visual fields are full to confrontation. Fundoscopic exam is normal with sharp discs and no vascular changes. Venous pulsations are present bilaterally. Pupils are 4 mm and briskly reactive to light. Visual acuity is 20/20 bilaterally. CN III, IV, VI: extraocular movement are normal. No ptosis. CN V: Facial sensation is intact to pinprick in all 3 divisions bilaterally. Corneal responses are intact.  CN VII: Face is symmetric with normal eye closure and smile. CN VIII: Hearing is normal to rubbing fingers CN IX, X: Palate elevates symmetrically. Phonation is normal. CN XI: Head turning and shoulder shrug are intact CN XII: Tongue is midline with normal movements and no atrophy.  MOTOR: There  is no pronator drift of out-stretched arms. Muscle bulk and tone are normal. Muscle strength is normal.   Shoulder abduction Shoulder external rotation Elbow flexion Elbow extension Wrist flexion Wrist extension Finger abduction Hip flexion Knee flexion Knee extension Ankle dorsi flexion Ankle plantar flexion  R 5 5 5 5 5 5 5 5 5 5 5 5   L 5 5 5 5 5 5 5 5 5 5 5 5     REFLEXES: Reflexes are 2+ and symmetric at the biceps, triceps, knees, and ankles. Plantar responses are flexor.  SENSORY: Light touch, pinprick, position sense, and vibration  sense are intact in fingers and toes.  COORDINATION: Rapid alternating movements and fine finger movements are intact. There is no dysmetria on finger-to-nose and heel-knee-shin. There are no abnormal or extraneous movements.   GAIT/STANCE: Posture is normal. Gait is steady with normal steps, base, arm swing, and turning. Heel and toe walking are normal. Tandem gait is normal.  Romberg is absent.      DIAGNOSTIC DATA (LABS, IMAGING, TESTING) - I reviewed patient records, labs, notes, testing and imaging myself where available.  Lab Results  Component Value Date   WBC 5.0 11/22/2012   HGB 10.5* 11/27/2012   HCT 30.7* 11/27/2012   MCV 91.6 11/22/2012   PLT 330 11/22/2012      Component Value Date/Time   NA 133* 11/29/2012 0430   K 4.3 11/29/2012 0430   CL 99 11/29/2012 0430   CO2 30 11/29/2012 0430   GLUCOSE 109* 11/29/2012 0430   BUN 8 11/29/2012 0430   CREATININE 1.02 11/29/2012 0430   CALCIUM 9.1 11/29/2012 0430   PROT 6.2 03/23/2012 2010   ALBUMIN 3.4* 03/23/2012 2010   AST 14 03/23/2012 2010   ALT 12 03/23/2012 2010   ALKPHOS 44 03/23/2012 2010   BILITOT 0.3 03/23/2012 2010   GFRNONAA 62* 11/29/2012 0430   GFRAA 72* 11/29/2012 0430    ASSESSMENT AND PLAN 54 y.o. year old female  has a past medical history of Fibromyalgia; Headache(784.0) (migraines); Arthritis (R hip); GERD (gastroesophageal reflux disease); Bursitis of left  shoulder; Rosacea; Tietze syndrome; Cancer; Gastric ulcer; Migraines; Hearing loss; Chest pain; and Vitamin D deficiency. here with:  1. Migraines  Patient headaches have been controlled with gabapentin 300 mg 3 times a day, which is also help her fibromyalgia Maxalt as needed Return to clinic in 6 months  Marcial Pacas, M.D. Ph.D.  [facility] [place], [place] [address] Phone: [phone] Fax:      [phone]

## 2014-09-22 NOTE — Patient Instructions (Signed)
Magnesium oxide 400mg Riboflavin 100mg twice a day 

## 2015-02-21 ENCOUNTER — Other Ambulatory Visit: Payer: Self-pay | Admitting: Obstetrics and Gynecology

## 2015-02-22 LAB — CYTOLOGY - PAP

## 2015-07-03 ENCOUNTER — Other Ambulatory Visit: Payer: Self-pay

## 2015-07-03 MED ORDER — GABAPENTIN 600 MG PO TABS: 600.0000 mg | ORAL_TABLET | Freq: Three times a day (TID) | ORAL | Status: DC

## 2015-07-03 NOTE — Telephone Encounter (Signed)
Last OV says patient is taking Gabapentin 30mg  three times daily.  Patient states she has been taking 600mg  three times daily.  Okay to fill 600mg  dose?  Thank you.

## 2015-09-07 ENCOUNTER — Other Ambulatory Visit: Payer: Self-pay | Admitting: Neurology

## 2015-09-25 ENCOUNTER — Encounter: Payer: Self-pay | Admitting: Nurse Practitioner

## 2015-09-25 ENCOUNTER — Ambulatory Visit (INDEPENDENT_AMBULATORY_CARE_PROVIDER_SITE_OTHER): Payer: BLUE CROSS/BLUE SHIELD | Admitting: Nurse Practitioner

## 2015-09-25 VITALS — BP 101/68 | HR 67 | Ht 67.0 in | Wt 144.4 lb

## 2015-09-25 DIAGNOSIS — M797 Fibromyalgia: Secondary | ICD-10-CM

## 2015-09-25 DIAGNOSIS — G43009 Migraine without aura, not intractable, without status migrainosus: Secondary | ICD-10-CM

## 2015-09-25 MED ORDER — GABAPENTIN 600 MG PO TABS: 600.0000 mg | ORAL_TABLET | Freq: Three times a day (TID) | ORAL | Status: DC

## 2015-09-25 MED ORDER — RIZATRIPTAN BENZOATE 10 MG PO TBDP: ORAL_TABLET | ORAL | Status: DC

## 2015-09-25 NOTE — Patient Instructions (Signed)
Continue with gabapentin 600 mg 3 times a day, which is also help her fibromyalgia will refill Maxalt as needed prn will refill F/U yearly

## 2015-09-25 NOTE — Progress Notes (Signed)
[facility]  PATIENT: Cristina Sanchez DOB: [birth year]   REASON FOR VISIT: Follow-up for migraine, fibromyalgia HISTORY FROM: Patient    HISTORY OF PRESENT ILLNESS: Initial evaluation October 2014, YY She had past medical history of fibromyalgia, reported a history of migraine headaches since 1983, her typical migraine started with silver lining in her visual field, and then tunneled vision, lasting for 25 minutes, followed by severe pounding headache was associated light noise sensitivity, worsening by movement, she has to lie in bed one day, sometimes even 3 days because of her severe migraines. Trigger for her migraines are cheesecakes, caffeine, chocolate, weather change, stress, Over the past several years, her migraine has much improved, she is also taking gabapentin 600 mg 3 times a day for her diffuse body aching pain, bilateral knee pain, which has been very helpful She used to take ibuprofen as needed for headaches, but is no longer taking it   UPDATE April 15th 2016: YY She is overall happy with her current migraine control, she has one to 3 typical migraines over one month, require Maxalt treatment, which has been very effective, otherwise, she has mild to moderate lateralized pounding headaches 2-3 times a day, lasting for a few hours, she is not taking any medication for that. She continues to take gabapentin 300 mg 3 times a day, which has helped her headache, and also fibromyalgia 1-3 /month,   UPDATE 04/18/2017CM Cristina Sanchez 55 year old female returns for yearly follow-up. She has a history of migraine headaches and thinks she may have one  to 2 migraines per month she has not kept a record. She reports that her headache frequency is good at present. She has more headaches during the winter months. She also has a history of fibromyalgia. She is currently on gabapentin for headache prevention and fibromyalgia as well as Maxalt acutely and occasional Flexeril.  She has had no medical issues since last seen. She returns for reevaluation REVIEW OF SYSTEMS: Full 14 system review of systems performed and notable only for those listed, all others are neg:  Constitutional: neg  Cardiovascular: neg Ear/Nose/Throat: Hearing loss with hearing aid  Skin: neg Eyes: neg Respiratory: neg Gastroitestinal: Occasional nausea  Hematology/Lymphatic: neg  Endocrine: Heat and cold intolerance Musculoskeletal joint pain Allergy/Immunology: Food allergies Neurological: Migraine headaches, dizziness Psychiatric: neg Sleep : Daytime sleepiness ALLERGIES: Allergies  Allergen Reactions  . Nsaids   . Sulfa Antibiotics Hives    dizzness    HOME MEDICATIONS: Outpatient Prescriptions Prior to Visit  Medication Sig Dispense Refill  . Cyanocobalamin (VITAMIN B-12 PO) Take by mouth.    . cyclobenzaprine (FLEXERIL) 10 MG tablet Take 1 tablet (10 mg total) by mouth 3 (three) times daily as needed. Muscle spasms 30 tablet 6  . gabapentin (NEURONTIN) 600 MG tablet Take 1 tablet (600 mg total) by mouth 3 (three) times daily. 270 tablet 0  . Multiple Vitamins-Minerals (MULTIVITAMIN PO) Take by mouth daily.    . rizatriptan (MAXALT-MLT) 10 MG disintegrating tablet TAKE 1 TABLET AT ONSET OF MIGRAINE. MAY REPEAT ONCE IN 2 HOURS IF NEEDED. 36 tablet 0   No facility-administered medications prior to visit.    PAST MEDICAL HISTORY: Past Medical History  Diagnosis Date  . Fibromyalgia   . Headache(784.0) migraines  . Arthritis R hip  . GERD (gastroesophageal reflux disease)   . Bursitis of left shoulder     hx of  . Rosacea   . Tietze syndrome   . Cancer ([place])  kidney cancer  . Gastric ulcer     hx of  . Migraines   . Hearing loss     Right   . Chest pain   . Vitamin D deficiency     PAST SURGICAL HISTORY: Past Surgical History  Procedure Laterality Date  . D&c x2    . Diliation and coutar    . Cystocele repair  2007  . Abdominal hysterectomy  2009  .  Rectocele repair  2007  . Dilation and curettage of uterus  [id number]    x2  . C section  1997  . Esophagogastroduodenoscopy  03/24/2012    Procedure: ESOPHAGOGASTRODUODENOSCOPY (EGD);  Surgeon: Milus Banister, MD;  Location: [facility];  Service: Endoscopy;  Laterality: N/A;  . Robot assisted laparoscopic nephrectomy Left 11/26/2012    Procedure: ROBOTIC ASSISTED LAPAROSCOPIC NEPHRECTOMY;  Surgeon: Alexis Frock, MD;  Location: [facility];  Service: Urology;  Laterality: Left;    FAMILY HISTORY: Family History  Problem Relation Age of Onset  . Dementia Mother     SOCIAL HISTORY: Social History   Social History  . Marital Status: Married    Spouse Name: Hassell Done  . Number of Children: 3  . Years of Education: college   Occupational History  .      Home maker   Social History Main Topics  . Smoking status: Never Smoker   . Smokeless tobacco: Never Used  . Alcohol Use: Yes     Comment: once or twice a year   . Drug Use: No  . Sexual Activity: Not on file   Other Topics Concern  . Not on file   Social History Narrative   Patient lives at home with her husband.    Patient has 3 children.    Patient is currently a homemaker.    Patient is right handed.            PHYSICAL EXAM  Filed Vitals:   09/25/15 0858  BP: 101/68  Pulse: 67  Height: 5\' 7"  (1.702 m)  Weight: 144 lb 6.4 oz (65.499 kg)   Body mass index is 22.61 kg/(m^2).  Generalized: Well developed, in no acute distress  Head: normocephalic and atraumatic,. Oropharynx benign  Neck: Supple, no carotid bruits  Cardiac: Regular rate rhythm, no murmur  Musculoskeletal: No deformity   Neurological examination   Mentation: Alert oriented to time, place, history taking. Attention span and concentration appropriate. Recent and remote memory intact.  Follows all commands speech and language fluent.   Cranial nerve II-XII: Fundoscopic exam reveals sharp disc margins.Pupils were equal round reactive to light  extraocular movements were full, visual field were full on confrontational test. Facial sensation and strength were normal. hearing was intact to finger rubbing bilaterally. Uvula tongue midline. head turning and shoulder shrug were normal and symmetric.Tongue protrusion into cheek strength was normal. Motor: normal bulk and tone, full strength in the BUE, BLE, fine finger movements normal, no pronator drift. No focal weakness Sensory: normal and symmetric to light touch, in the upper and lower extremities Coordination: finger-nose-finger, heel-to-shin bilaterally, no dysmetria Reflexes: Brachioradialis 2/2, biceps 2/2, triceps 2/2, patellar 2/2, Achilles 2/2, plantar responses were flexor bilaterally. Gait and Station: Rising up from seated position without assistance, normal stance,  moderate stride, good arm swing, smooth turning, able to perform tiptoe, and heel walking without difficulty. Tandem gait is steady  DIAGNOSTIC DATA (LABS, IMAGING, TESTING) - ASSESSMENT AND PLAN 55 y.o. year old female  has a past medical history of  Fibromyalgia; Headache(784.0) (migraines); Arthritis (R hip); GERD (gastroesophageal reflux disease); Bursitis of left shoulder; Rosacea; Tietze syndrome; Cancer; Gastric ulcer; Migraines; Hearing loss; Chest pain; and Vitamin D deficiency. here to follow-up for migraines which are currently well controlled at present having 1-2 per month.  Continue with gabapentin 600 mg 3 times a day, which is also help her fibromyalgia will refill Maxalt as needed prn will refill Discussed migraine triggers in terms of  environmental I spent additional 10 minutes in total face to face time with the patient more than 50% of which was spent counseling and coordination of care, reviewing test results reviewing medications and discussing and reviewing the diagnosis of migraine. Importance of keeping a diary if headaches worsen to include the time of the headache what you're doing any other  specific information that would be useful. Discussed stress relief techniques such as deep breathing muscle relaxation mental relaxation to music. Discussed importance of exercise, regular meals  and sleep. Sleep deprivation can be a migraine trigger F/U yearlyVst time 25 min Dennie Bible, [facility], [facility], APRN  [facility] [address] [place], [place] [address] [phone]

## 2015-09-25 NOTE — Progress Notes (Signed)
I have reviewed and agreed above plan. 

## 2015-12-03 DIAGNOSIS — H903 Sensorineural hearing loss, bilateral: Secondary | ICD-10-CM | POA: Diagnosis not present

## 2016-04-15 DIAGNOSIS — D3000 Benign neoplasm of unspecified kidney: Secondary | ICD-10-CM | POA: Diagnosis not present

## 2016-04-18 DIAGNOSIS — R935 Abnormal findings on diagnostic imaging of other abdominal regions, including retroperitoneum: Secondary | ICD-10-CM | POA: Diagnosis not present

## 2016-04-18 DIAGNOSIS — Z85528 Personal history of other malignant neoplasm of kidney: Secondary | ICD-10-CM | POA: Diagnosis not present

## 2016-04-24 DIAGNOSIS — D3000 Benign neoplasm of unspecified kidney: Secondary | ICD-10-CM | POA: Diagnosis not present

## 2016-09-24 ENCOUNTER — Encounter: Payer: Self-pay | Admitting: Nurse Practitioner

## 2016-09-24 ENCOUNTER — Encounter (INDEPENDENT_AMBULATORY_CARE_PROVIDER_SITE_OTHER): Payer: Self-pay

## 2016-09-24 ENCOUNTER — Ambulatory Visit (INDEPENDENT_AMBULATORY_CARE_PROVIDER_SITE_OTHER): Payer: BLUE CROSS/BLUE SHIELD | Admitting: Nurse Practitioner

## 2016-09-24 VITALS — BP 100/69 | HR 91 | Wt 141.8 lb

## 2016-09-24 DIAGNOSIS — G43009 Migraine without aura, not intractable, without status migrainosus: Secondary | ICD-10-CM

## 2016-09-24 DIAGNOSIS — M797 Fibromyalgia: Secondary | ICD-10-CM | POA: Diagnosis not present

## 2016-09-24 MED ORDER — CYCLOBENZAPRINE HCL 10 MG PO TABS: 10.0000 mg | ORAL_TABLET | Freq: Two times a day (BID) | ORAL | 3 refills | Status: DC

## 2016-09-24 MED ORDER — RIZATRIPTAN BENZOATE 10 MG PO TBDP: ORAL_TABLET | ORAL | 3 refills | Status: DC

## 2016-09-24 NOTE — Patient Instructions (Addendum)
Discontinue  gabapentin . Maxalt acutely as needed prn will refill Flexeril 10mg  1/2 to 1 tab twice daily Discussed migraine triggers in terms of  Environmental Migraine  tracker APP to record headaches Follow up yearly

## 2016-09-24 NOTE — Progress Notes (Signed)
[facility]  PATIENT: Cristina Sanchez DOB: 06-Dec-1960   REASON FOR VISIT: Follow-up for migraine, fibromyalgia HISTORY FROM: Patient    HISTORY OF PRESENT ILLNESS: UPDATE 04/18/2017CM Cristina Sanchez 56 year old female returns for yearly follow-up. She has a history of migraine headaches and thinks she may have one  to 2 migraines per month she has not kept a record. She reports that her headache frequency is good at present. She has more headaches during the winter months. She also has a history of fibromyalgia. She is currently on gabapentin for headache prevention and fibromyalgia as well as Maxalt acutely and occasional Flexeril. She has had no medical issues since last seen. She returns for reevaluation  Update 04/18/2018CM Cristina Sanchez, 56 year old female returns for follow-up with history of migraine headaches and fibromyalgia. She had a stretch headache in December  when she stayed in bed. She continues to take Maxalt acutely which works. She also uses Biofreeze topically to forehead. She has stopped her gabapentin. She remains on Flexeril only takes medication when necessary.No new medical issues since last seen. She does not want any additional medications added. She returns for reevaluation  REVIEW OF SYSTEMS: Full 14 system review of systems performed and notable only for those listed, all others are neg:  Constitutional: neg  Cardiovascular: neg Ear/Nose/Throat: Hearing loss with hearing aid  Skin: neg Eyes: neg Respiratory: neg Gastroitestinal: Occasional nausea  Hematology/Lymphatic: neg  Endocrine: Heat and cold intolerance Musculoskeletal joint pain Allergy/Immunology: Food allergies Neurological: Migraine headaches,  Psychiatric: neg Sleep :neg ALLERGIES: Allergies  Allergen Reactions  . Nsaids   . Sulfa Antibiotics Hives    dizzness    HOME MEDICATIONS: Outpatient Medications Prior to Visit  Medication Sig Dispense Refill  .  Cholecalciferol ([employer] VITAMIN D3 DROPS/INFANT PO) Take 4,000 Units by mouth. Twice weekly    . Cyanocobalamin (VITAMIN B-12 PO) Take by mouth.    . cyclobenzaprine (FLEXERIL) 10 MG tablet Take 1 tablet (10 mg total) by mouth 3 (three) times daily as needed. Muscle spasms 30 tablet 6  . Multiple Vitamins-Minerals (MULTIVITAMIN PO) Take by mouth daily.    Marland Kitchen PENNSAID 2 % SOLN apply ONE pump TO THE affected AREA two TIMES daily AS NEEDED  1  . rizatriptan (MAXALT-MLT) 10 MG disintegrating tablet TAKE 1 TABLET AT ONSET OF MIGRAINE. MAY REPEAT ONCE IN 2 HOURS IF NEEDED. 36 tablet 3  . gabapentin (NEURONTIN) 600 MG tablet Take 1 tablet (600 mg total) by mouth 3 (three) times daily. (Patient not taking: Reported on 09/24/2016) 270 tablet 3   No facility-administered medications prior to visit.     PAST MEDICAL HISTORY: Past Medical History:  Diagnosis Date  . Arthritis R hip  . Bursitis of left shoulder    hx of  . Cancer [facility])    kidney cancer  . Chest pain   . Fibromyalgia   . Gastric ulcer    hx of  . GERD (gastroesophageal reflux disease)   . Headache(784.0) migraines  . Hearing loss    Right   . Migraines   . Rosacea   . Tietze syndrome   . Vitamin D deficiency     PAST SURGICAL HISTORY: Past Surgical History:  Procedure Laterality Date  . ABDOMINAL HYSTERECTOMY  2009  . c section  1997  . CYSTOCELE REPAIR  2007  . D&c x2    . DILATION AND CURETTAGE OF UTERUS  [id number]   x2  . Diliation and Coutar    . ESOPHAGOGASTRODUODENOSCOPY  03/24/2012   Procedure: ESOPHAGOGASTRODUODENOSCOPY (EGD);  Surgeon: Milus Banister, MD;  Location: [facility];  Service: Endoscopy;  Laterality: N/A;  . RECTOCELE REPAIR  2007  . ROBOT ASSISTED LAPAROSCOPIC NEPHRECTOMY Left 11/26/2012   Procedure: ROBOTIC ASSISTED LAPAROSCOPIC NEPHRECTOMY;  Surgeon: Alexis Frock, MD;  Location: [facility];  Service: Urology;  Laterality: Left;    FAMILY HISTORY: Family History  Problem Relation Age of Onset    . Dementia Mother     SOCIAL HISTORY: Social History   Social History  . Marital status: Married    Spouse name: martin  . Number of children: 3  . Years of education: college   Occupational History  .      Home maker   Social History Main Topics  . Smoking status: Never Smoker  . Smokeless tobacco: Never Used  . Alcohol use Yes     Comment: once or twice a year   . Drug use: No  . Sexual activity: Not on file   Other Topics Concern  . Not on file   Social History Narrative   Patient lives at home with her husband.    Patient has 3 children.    Patient is currently a homemaker.    Patient is right handed.            PHYSICAL EXAM  Vitals:   09/24/16 1011  BP: 100/69  Pulse: 91  Weight: 141 lb 12.8 oz (64.3 kg)   Body mass index is 22.21 kg/m.  Generalized: Well developed, in no acute distress  Head: normocephalic and atraumatic,. Oropharynx benign  Neck: Supple, no carotid bruits  Cardiac: Regular rate rhythm, no murmur  Musculoskeletal: No deformity   Neurological examination   Mentation: Alert oriented to time, place, history taking. Attention span and concentration appropriate. Recent and remote memory intact.  Follows all commands speech and language fluent.   Cranial nerve II-XII: Pupils were equal round reactive to light extraocular movements were full, visual field were full on confrontational test. Facial sensation and strength were normal. hearing was intact to finger rubbing bilaterally. Uvula tongue midline. head turning and shoulder shrug were normal and symmetric.Tongue protrusion into cheek strength was normal. Motor: normal bulk and tone, full strength in the BUE, BLE, fine finger movements normal, no pronator drift. No focal weakness Sensory: normal and symmetric to light touch, in the upper and lower extremities Coordination: finger-nose-finger, heel-to-shin bilaterally, no dysmetria Reflexes: Brachioradialis 2/2, biceps 2/2, triceps  2/2, patellar 2/2, Achilles 2/2, plantar responses were flexor bilaterally. Gait and Station: Rising up from seated position without assistance, normal stance,  moderate stride, good arm swing, smooth turning, able to perform tiptoe, and heel walking without difficulty. Tandem gait is steady  DIAGNOSTIC DATA (LABS, IMAGING, TESTING) - ASSESSMENT AND PLAN 56 y.o. year old female  has a past medical history of Fibromyalgia; Headache(784.0) (migraines); Arthritis (R hip);  Migraines; Hearing loss; here to follow-up for migraines which are currently well controlled at present having 1-2 per month. Patient has stopped her gabapentin and does not wish to resume at a lower dose  Discontinue  gabapentin . Maxalt acutely as needed prn will refill Flexeril 10mg  1/2 to 1 tab twice daily take a dose at bedtime  Discussed migraine triggers Migraine  tracker APP to record migraines if they increase Follow up yearly I spent  15 minutes in total face to face time with the patient more than 50% of which was spent counseling and coordination of care, reviewing test results  reviewing medications and discussing and reviewing the diagnosis of migraine. Importance of keeping a diary if headaches worsen to include the time of the headache what you're doing any other specific information that would be useful. Discussed stress relief techniques such as deep breathing muscle relaxation mental relaxation to music. Discussed importance of exercise, regular meals  and sleep. Sleep deprivation can be a migraine trigger. Encouraged patient to take Flexeril at night that will be beneficial for sleep, headache preventative and helpful for fibromyalgia. F/U yearly Dennie Bible, [facility], [facility], APRN  [facility] [address] [place], [place] [address] [phone]

## 2016-09-24 NOTE — Progress Notes (Signed)
I have reviewed and agreed above plan. 

## 2016-11-28 DIAGNOSIS — M25571 Pain in right ankle and joints of right foot: Secondary | ICD-10-CM | POA: Diagnosis not present

## 2016-12-29 DIAGNOSIS — S93491A Sprain of other ligament of right ankle, initial encounter: Secondary | ICD-10-CM | POA: Diagnosis not present

## 2017-02-10 DIAGNOSIS — Z0000 Encounter for general adult medical examination without abnormal findings: Secondary | ICD-10-CM | POA: Diagnosis not present

## 2017-02-12 DIAGNOSIS — E7800 Pure hypercholesterolemia, unspecified: Secondary | ICD-10-CM | POA: Diagnosis not present

## 2017-04-20 DIAGNOSIS — Z1231 Encounter for screening mammogram for malignant neoplasm of breast: Secondary | ICD-10-CM | POA: Diagnosis not present

## 2017-04-20 DIAGNOSIS — Z6822 Body mass index (BMI) 22.0-22.9, adult: Secondary | ICD-10-CM | POA: Diagnosis not present

## 2017-04-20 DIAGNOSIS — Z01419 Encounter for gynecological examination (general) (routine) without abnormal findings: Secondary | ICD-10-CM | POA: Diagnosis not present

## 2017-04-21 DIAGNOSIS — Z1329 Encounter for screening for other suspected endocrine disorder: Secondary | ICD-10-CM | POA: Diagnosis not present

## 2017-04-21 DIAGNOSIS — Z1321 Encounter for screening for nutritional disorder: Secondary | ICD-10-CM | POA: Diagnosis not present

## 2017-09-23 NOTE — Progress Notes (Signed)
[facility]  PATIENT: Cristina Sanchez DOB: 1961-04-15   REASON FOR VISIT: Follow-up for migraine, fibromyalgia HISTORY FROM: Patient    HISTORY OF PRESENT ILLNESS: UPDATE 04/18/2017CM Cristina Sanchez 57 year old female returns for yearly follow-up. She has a history of migraine headaches and thinks she may have one  to 2 migraines per month she has not kept a record. She reports that her headache frequency is good at present. She has more headaches during the winter months. She also has a history of fibromyalgia. She is currently on gabapentin for headache prevention and fibromyalgia as well as Maxalt acutely and occasional Flexeril. She has had no medical issues since last seen. She returns for reevaluation  Update 04/18/2018CM Cristina Sanchez, 57 year old female returns for follow-up with history of migraine headaches and fibromyalgia. She had a stretch headache in December  when she stayed in bed. She continues to take Maxalt acutely which works. She also uses Biofreeze topically to forehead. She has stopped her gabapentin. She remains on Flexeril only takes medication when necessary.No new medical issues since last seen. She does not want any additional medications added. She returns for reevaluation UPDATE 4/18/2019CM Cristina Sanchez, 57 year old female returns for follow-up with history of migraine headaches and fibromyalgia.  She continues to take Maxalt acutely which continues to work.  She remains on Flexeril but only takes the medication when necessary when she has a headache she also uses Biofreeze on her forehead when she has a headache especially if she is  at work.  She is on a vegan diet.  She does not wish to have any additional medications.  She reports that some of her recent headaches are due to stress.  Her brother recently diagnosed with kidney cancer and her sister with recurrence of cervical cancer and heart attack.  She returns for reevaluation REVIEW OF SYSTEMS:  Full 14 system review of systems performed and notable only for those listed, all others are neg:  Constitutional: neg  Cardiovascular: neg Ear/Nose/Throat: Hearing loss with hearing aid  Skin: neg Eyes: neg Respiratory: neg Gastroitestinal: neg Hematology/Lymphatic: neg  Endocrine: neg Musculoskeletal joint pain Allergy/Immunology: Food allergies Neurological: Migraine headaches,  Psychiatric: neg Sleep :neg ALLERGIES: Allergies  Allergen Reactions  . Nsaids   . Sulfa Antibiotics Hives    dizzness    HOME MEDICATIONS: Outpatient Medications Prior to Visit  Medication Sig Dispense Refill  . Cholecalciferol ([facility] VITAMIN D3 DROPS/INFANT PO) Take 4,000 Units by mouth. Twice weekly    . Cyanocobalamin (VITAMIN B-12 PO) Take by mouth.    . cyclobenzaprine (FLEXERIL) 10 MG tablet Take 1 tablet (10 mg total) by mouth 2 (two) times daily. Muscle spasms 180 tablet 3  . Menthol, Topical Analgesic, (BIOFREEZE EX) Apply topically.    . Multiple Vitamins-Minerals (MULTIVITAMIN PO) Take by mouth daily.    Marland Kitchen PENNSAID 2 % SOLN apply ONE pump TO THE affected AREA two TIMES daily AS NEEDED  1  . rizatriptan (MAXALT-MLT) 10 MG disintegrating tablet TAKE 1 TABLET AT ONSET OF MIGRAINE. MAY REPEAT ONCE IN 2 HOURS IF NEEDED. 36 tablet 3   No facility-administered medications prior to visit.     PAST MEDICAL HISTORY: Past Medical History:  Diagnosis Date  . Arthritis R hip  . Bursitis of left shoulder    hx of  . Cancer [facility])    kidney cancer  . Chest pain   . Fibromyalgia   . Gastric ulcer    hx of  . GERD (gastroesophageal reflux disease)   .  Headache(784.0) migraines  . Hearing loss    Right   . Migraines   . Rosacea   . Tietze syndrome   . Vitamin D deficiency     PAST SURGICAL HISTORY: Past Surgical History:  Procedure Laterality Date  . ABDOMINAL HYSTERECTOMY  2009  . c section  1997  . CYSTOCELE REPAIR  2007  . D&c x2    . DILATION AND CURETTAGE OF UTERUS  8144,8185     x2  . Diliation and Coutar    . ESOPHAGOGASTRODUODENOSCOPY  03/24/2012   Procedure: ESOPHAGOGASTRODUODENOSCOPY (EGD);  Surgeon: Milus Banister, MD;  Location: [facility];  Service: Endoscopy;  Laterality: N/A;  . RECTOCELE REPAIR  2007  . ROBOT ASSISTED LAPAROSCOPIC NEPHRECTOMY Left 11/26/2012   Procedure: ROBOTIC ASSISTED LAPAROSCOPIC NEPHRECTOMY;  Surgeon: Alexis Frock, MD;  Location: [facility];  Service: Urology;  Laterality: Left;    FAMILY HISTORY: Family History  Problem Relation Age of Onset  . Dementia Mother   . Cervical cancer Sister   . Kidney cancer Brother     SOCIAL HISTORY: Social History   Socioeconomic History  . Marital status: Married    Spouse name: martin  . Number of children: 3  . Years of education: college  . 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 Financial resource strain: Not on file  . Food insecurity:    Worry: Not on file    Inability: Not on file  . Transportation needs:    Medical: Not on file    Non-medical: Not on file  Tobacco Use  . Smoking status: Never Smoker  . Smokeless tobacco: Never Used  Substance and Sexual Activity  . Alcohol use: Yes    Comment: once or twice a year   . Drug use: No  . Sexual activity: Not on file  Lifestyle  . Physical activity:    Days per week: Not on file    Minutes per session: Not on file  . Stress: Not on file  Relationships  . Social connections:    Talks on phone: Not on file    Gets together: Not on file    Attends religious service: Not on file    Active member of club or organization: Not on file    Attends meetings of clubs or organizations: Not on file    Relationship status: Not on file  . Intimate partner violence:    Fear of current or ex partner: Not on file    Emotionally abused: Not on file    Physically abused: Not on file    Forced sexual activity: Not on file  Other Topics Concern  . Not on file  Social  History Narrative   Patient lives at home with her husband.    Patient has 3 children.    Patient is currently a homemaker.    Patient is right handed.            PHYSICAL EXAM  Vitals:   09/24/17 1118  BP: 104/72  Pulse: 78  Weight: 142 lb 12.8 oz (64.8 kg)  Height: 5\' 7"  (1.702 m)   Body mass index is 22.37 kg/m.  Generalized: Well developed, in no acute distress  Head: normocephalic and atraumatic,. Oropharynx benign  Neck: Supple,  Musculoskeletal: No deformity   Neurological examination   Mentation: Alert oriented to time, place, history taking. Attention span and concentration appropriate. Recent and remote memory intact.  Follows all commands speech and language fluent.   Cranial nerve II-XII: Pupils were equal round reactive to light extraocular movements were full, visual field were full on confrontational test. Facial sensation and strength were normal. hearing was intact to finger rubbing bilaterally. Uvula tongue midline. head turning and shoulder shrug were normal and symmetric.Tongue protrusion into cheek strength was normal. Motor: normal bulk and tone, full strength in the BUE, BLE,  Sensory: normal and symmetric to light touch, pinprick and vibratory in the upper and lower extremities Coordination: finger-nose-finger, heel-to-shin bilaterally, no dysmetria Reflexes: Symmetric upper and lower plantar responses were flexor bilaterally. Gait and Station: Rising up from seated position without assistance, normal stance,  moderate stride, good arm swing, smooth turning, able to perform tiptoe, and heel walking without difficulty. Tandem gait is steady  DIAGNOSTIC DATA (LABS, IMAGING, TESTING) - ASSESSMENT AND PLAN 57 y.o. year old female  has a past medical history of Fibromyalgia; Headache(784.0) (migraines); Arthritis (R hip);  Migraines; Hearing loss; here to follow-up for migraines which are currently well controlled at present having 1-2 per month. Patient  has stopped her gabapentin and does not wish to resume at a lower dose  Maxalt acutely as needed prn will refill Flexeril 10mg  1/2 to 1 tab twice daily prn will refill Migraine  tracker APP to record migraines if they increase Follow up yearly I spent  20 minutes in total face to face time with the patient more than 50% of which was spent counseling and coordination of care, reviewing test results reviewing medications and discussing and reviewing the diagnosis of migraine. Importance of keeping a diary if headaches worsen to include the time of the headache what you're doing any other specific information that would be useful. Discussed stress relief techniques such as deep breathing muscle relaxation mental relaxation to music. Discussed importance of exercise, regular meals  and sleep. Sleep deprivation can be a migraine trigger. Encouraged patient to take Flexeril at night that will be beneficial for sleep, headache preventative and helpful for fibromyalgia.  She was also given a list of foods that are known migraine triggers.  She is on a vegan diet Dennie Bible, [facility], [facility], APRN  [facility] [address] [place], [place] [address] [phone]

## 2017-09-24 ENCOUNTER — Ambulatory Visit: Payer: BLUE CROSS/BLUE SHIELD | Admitting: Nurse Practitioner

## 2017-09-24 ENCOUNTER — Encounter: Payer: Self-pay | Admitting: Nurse Practitioner

## 2017-09-24 VITALS — BP 104/72 | HR 78 | Ht 67.0 in | Wt 142.8 lb

## 2017-09-24 DIAGNOSIS — G43009 Migraine without aura, not intractable, without status migrainosus: Secondary | ICD-10-CM | POA: Diagnosis not present

## 2017-09-24 MED ORDER — RIZATRIPTAN BENZOATE 10 MG PO TBDP: ORAL_TABLET | ORAL | 3 refills | Status: DC

## 2017-09-24 MED ORDER — CYCLOBENZAPRINE HCL 10 MG PO TABS: 10.0000 mg | ORAL_TABLET | Freq: Two times a day (BID) | ORAL | 3 refills | Status: AC

## 2017-09-24 NOTE — Patient Instructions (Signed)
Maxalt acutely as needed prn will refill Flexeril 10mg  1/2 to 1 tab twice daily prn will refill Migraine  tracker APP to record migraines if they increase Follow up yearly

## 2017-09-24 NOTE — Progress Notes (Signed)
I have reviewed and agreed above plan. 

## 2018-01-04 DIAGNOSIS — R42 Dizziness and giddiness: Secondary | ICD-10-CM | POA: Diagnosis not present

## 2018-01-04 DIAGNOSIS — E559 Vitamin D deficiency, unspecified: Secondary | ICD-10-CM | POA: Diagnosis not present

## 2018-01-04 DIAGNOSIS — R5383 Other fatigue: Secondary | ICD-10-CM | POA: Diagnosis not present

## 2018-01-04 DIAGNOSIS — E7800 Pure hypercholesterolemia, unspecified: Secondary | ICD-10-CM | POA: Diagnosis not present

## 2018-01-28 DIAGNOSIS — E559 Vitamin D deficiency, unspecified: Secondary | ICD-10-CM | POA: Diagnosis not present

## 2018-01-28 DIAGNOSIS — R5383 Other fatigue: Secondary | ICD-10-CM | POA: Diagnosis not present

## 2018-01-28 DIAGNOSIS — E7800 Pure hypercholesterolemia, unspecified: Secondary | ICD-10-CM | POA: Diagnosis not present

## 2018-02-24 DIAGNOSIS — H903 Sensorineural hearing loss, bilateral: Secondary | ICD-10-CM | POA: Diagnosis not present

## 2018-05-13 DIAGNOSIS — Z01419 Encounter for gynecological examination (general) (routine) without abnormal findings: Secondary | ICD-10-CM | POA: Diagnosis not present

## 2018-05-13 DIAGNOSIS — Z6822 Body mass index (BMI) 22.0-22.9, adult: Secondary | ICD-10-CM | POA: Diagnosis not present

## 2018-05-13 DIAGNOSIS — Z1231 Encounter for screening mammogram for malignant neoplasm of breast: Secondary | ICD-10-CM | POA: Diagnosis not present

## 2018-06-29 DIAGNOSIS — Z1159 Encounter for screening for other viral diseases: Secondary | ICD-10-CM | POA: Diagnosis not present

## 2018-06-29 DIAGNOSIS — Z0000 Encounter for general adult medical examination without abnormal findings: Secondary | ICD-10-CM | POA: Diagnosis not present

## 2018-09-22 ENCOUNTER — Telehealth: Payer: Self-pay

## 2018-09-22 NOTE — Telephone Encounter (Signed)
I called and left a detailed message making patient aware that due to current COVID 19 pandemic, our office is severely reducing in office visits, in order to minimize the risk to our patients and healthcare providers. I explained that we are converting in-office visits to virtual visits and asked that she call us back for further details and to provide [place] with her email address so that we could send her the information.

## 2018-09-23 NOTE — Telephone Encounter (Signed)
I tried calling patient again to discuss her appt on 09/28/18. Patient did not answer so I left another message asking that she call us back.

## 2018-09-24 ENCOUNTER — Ambulatory Visit: Payer: BLUE CROSS/BLUE SHIELD | Admitting: Nurse Practitioner

## 2018-09-28 ENCOUNTER — Encounter: Payer: Self-pay | Admitting: Neurology

## 2018-09-28 ENCOUNTER — Ambulatory Visit (INDEPENDENT_AMBULATORY_CARE_PROVIDER_SITE_OTHER): Payer: BLUE CROSS/BLUE SHIELD | Admitting: Neurology

## 2018-09-28 ENCOUNTER — Other Ambulatory Visit: Payer: Self-pay

## 2018-09-28 DIAGNOSIS — G43009 Migraine without aura, not intractable, without status migrainosus: Secondary | ICD-10-CM

## 2018-09-28 MED ORDER — RIZATRIPTAN BENZOATE 10 MG PO TBDP: ORAL_TABLET | ORAL | 4 refills | Status: DC

## 2018-09-28 MED ORDER — TOPIRAMATE 25 MG PO TABS: 25.0000 mg | ORAL_TABLET | Freq: Every day | ORAL | 6 refills | Status: DC

## 2018-09-28 NOTE — Progress Notes (Signed)
   [facility]  PATIENT: Cristina Sanchez DOB: Aug 20, 1960   HISTORY OF PRESENT ILLNESS:  Virtual Visit via Video  I connected with Cristina Sanchez on 09/28/18 at  by video and verified that I am speaking with the correct person using two identifiers.   I discussed the limitations, risks, security and privacy concerns of performing an evaluation and management service by video and the availability of in person appointments. I also discussed with the patient that there may be a patient responsible charge related to this service. The patient expressed understanding and agreed to proceed.   History of Present Illness: Cristina Sanchez has been followed up with our clinic for her chronic migraine with aura, also has history of fibromyalgia.  She continue have frequent dizziness spells, and sometimes her typical migraine headaches, about once or twice each week, she describes sudden onset transient vertigo sensation, with tinnitus, sometimes associated with her lateralized migraine headaches, light noise sensitivity, nauseous, can lasting for hours to days, sometimes her migraine is proceeded with visual auras,  Her sister, all 3 of her children has typical migraine headaches, sometimes with visual auras   She went through a lot of stress at the end of 2019, last few family members  Observations/Objective: I have reviewed problem lists, medications, allergies.  Awake alert oriented to history taking care of conversation  Assessment and Plan: Chronic migraine headaches  After discussed with patient, we decided to try preventive medications Topamax 25 mg, titrating to 4 tablets every night as preventive medications  Continue Maxalt as needed, may mix with Aleve, Flexeril for prolonged headaches,  Follow Up Instructions:  6 months    I discussed the assessment and treatment plan with the patient. The patient was provided an opportunity to ask questions and all were answered. The  patient agreed with the plan and demonstrated an understanding of the instructions.   The patient was advised to call back or seek an in-person evaluation if the symptoms worsen or if the condition fails to improve as anticipated.  I provided 25 minutes of non-face-to-face time during this encounter.   Marcial Pacas, MD. Ph.D.

## 2018-10-06 DIAGNOSIS — S80862A Insect bite (nonvenomous), left lower leg, initial encounter: Secondary | ICD-10-CM | POA: Diagnosis not present

## 2018-10-06 DIAGNOSIS — L03116 Cellulitis of left lower limb: Secondary | ICD-10-CM | POA: Diagnosis not present

## 2018-12-29 DIAGNOSIS — M797 Fibromyalgia: Secondary | ICD-10-CM | POA: Diagnosis not present

## 2018-12-29 DIAGNOSIS — R5383 Other fatigue: Secondary | ICD-10-CM | POA: Diagnosis not present

## 2018-12-29 DIAGNOSIS — E559 Vitamin D deficiency, unspecified: Secondary | ICD-10-CM | POA: Diagnosis not present

## 2019-04-07 ENCOUNTER — Ambulatory Visit: Payer: BC Managed Care – PPO | Admitting: Neurology

## 2019-04-07 ENCOUNTER — Encounter: Payer: Self-pay | Admitting: Neurology

## 2019-04-07 ENCOUNTER — Other Ambulatory Visit: Payer: Self-pay

## 2019-04-07 VITALS — BP 135/77 | HR 74 | Temp 97.5°F | Ht 67.0 in | Wt 145.5 lb

## 2019-04-07 DIAGNOSIS — G43009 Migraine without aura, not intractable, without status migrainosus: Secondary | ICD-10-CM | POA: Diagnosis not present

## 2019-04-07 MED ORDER — NORTRIPTYLINE HCL 10 MG PO CAPS: 20.0000 mg | ORAL_CAPSULE | Freq: Every day | ORAL | 11 refills | Status: DC

## 2019-04-07 NOTE — Progress Notes (Signed)
PATIENT: Cristina Sanchez DOB: May 23, 1961  Chief Complaint  Patient presents with  . Migraine    She estimates having tension headaches about twice weekly.  Typically, only one migraine every two weeks.  Maxalt works well to dull the pain of her more severe headaches.  She was reluctant to try topiramate and never started the medication.  Feels her migraines were increased last year due to significant stress.     HISTORICAL  Cristina Sanchez has been followed up with our clinic for her chronic migraine with aura, also has history of fibromyalgia.  She continue have frequent dizziness spells,  also typical migraine headaches, about once or twice each week, she describes sudden onset transient vertigo sensation, with tinnitus, sometimes associated with her lateralized migraine headaches, light noise sensitivity, nauseous, can lasting for hours to days, sometimes her migraine is proceeded with visual auras,  Her sister, all 3 of her children has typical migraine headaches, sometimes with visual auras   She went through a lot of stress at the end of 2019, lost few family members  UPDATE Apr 07 2019: She continues to have frequent headaches, migraines twice a week, lateralized severe pounding headache with light noise sensitivity, Maxalt works well, takes away her headache in 30 to 60 minutes, she has been taking magnesium oxide and riboflavin, did not try Topamax as prescribed, worried about side effect  She complains a lot of stress,  REVIEW OF SYSTEMS: Full 14 system review of systems performed and notable only for as above All other review of systems were negative.  ALLERGIES: Allergies  Allergen Reactions  . Nsaids   . Sulfa Antibiotics Hives    dizzness    HOME MEDICATIONS: Current Outpatient Medications  Medication Sig Dispense Refill  . Cholecalciferol (CVS VITAMIN D3 DROPS/INFANT PO) Take 4,000 Units by mouth. Twice weekly    . Cyanocobalamin (VITAMIN B-12 PO) Take by mouth.    .  cyclobenzaprine (FLEXERIL) 10 MG tablet Take 1 tablet (10 mg total) by mouth 2 (two) times daily. Muscle spasms 180 tablet 3  . Menthol, Topical Analgesic, (BIOFREEZE EX) Apply topically. Biofreeze - up to four times daily.    . Multiple Vitamins-Minerals (MULTIVITAMIN PO) Take by mouth daily.    Marland Kitchen PENNSAID 2 % SOLN apply ONE pump TO THE affected AREA two TIMES daily AS NEEDED  1  . rizatriptan (MAXALT-MLT) 10 MG disintegrating tablet TAKE 1 TABLET AT ONSET OF MIGRAINE. MAY REPEAT ONCE IN 2 HOURS IF NEEDED. 36 tablet 4   No current facility-administered medications for this visit.     PAST MEDICAL HISTORY: Past Medical History:  Diagnosis Date  . Arthritis R hip  . Bursitis of left shoulder    hx of  . Cancer [facility])    kidney cancer  . Chest pain   . Fibromyalgia   . Gastric ulcer    hx of  . GERD (gastroesophageal reflux disease)   . Headache(784.0) migraines  . Hearing loss    Right   . Migraines   . Rosacea   . Tietze syndrome   . Vitamin D deficiency     PAST SURGICAL HISTORY: Past Surgical History:  Procedure Laterality Date  . ABDOMINAL HYSTERECTOMY  2009  . c section  1997  . CYSTOCELE REPAIR  2007  . D&c x2    . DILATION AND CURETTAGE OF UTERUS  [id number]   x2  . Diliation and Coutar    . ESOPHAGOGASTRODUODENOSCOPY  03/24/2012   Procedure: ESOPHAGOGASTRODUODENOSCOPY (EGD);  Surgeon: Milus Banister, MD;  Location: [facility];  Service: Endoscopy;  Laterality: N/A;  . RECTOCELE REPAIR  2007  . ROBOT ASSISTED LAPAROSCOPIC NEPHRECTOMY Left 11/26/2012   Procedure: ROBOTIC ASSISTED LAPAROSCOPIC NEPHRECTOMY;  Surgeon: Alexis Frock, MD;  Location: [facility];  Service: Urology;  Laterality: Left;    FAMILY HISTORY: Family History  Problem Relation Age of Onset  . Dementia Mother   . Cervical cancer Sister   . Kidney cancer Brother     SOCIAL HISTORY: Social History   Socioeconomic History  . Marital status: Married    Spouse name: Cristina Sanchez  . Number of  children: 3  . Years of education: college  . 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 Financial resource strain: Not on file  . Food insecurity    Worry: Not on file    Inability: Not on file  . Transportation needs    Medical: Not on file    Non-medical: Not on file  Tobacco Use  . Smoking status: Never Smoker  . Smokeless tobacco: Never Used  Substance and Sexual Activity  . Alcohol use: Yes    Comment: once or twice a year   . Drug use: No  . Sexual activity: Not on file  Lifestyle  . Physical activity    Days per week: Not on file    Minutes per session: Not on file  . Stress: Not on file  Relationships  . Social Herbalist on phone: Not on file    Gets together: Not on file    Attends religious service: Not on file    Active member of club or organization: Not on file    Attends meetings of clubs or organizations: Not on file    Relationship status: Not on file  . Intimate partner violence    Fear of current or ex partner: Not on file    Emotionally abused: Not on file    Physically abused: Not on file    Forced sexual activity: Not on file  Other Topics Concern  . Not on file  Social History Narrative   Patient lives at home with her husband.    Patient has 3 children.    Patient is currently a homemaker.    Patient is right handed.            PHYSICAL EXAM   Vitals:   04/07/19 1313  BP: 135/77  Pulse: 74  Temp: (!) 97.5 F (36.4 C)  Weight: 145 lb 8 oz (66 kg)  Height: 5\' 7"  (1.702 m)    Not recorded      Body mass index is 22.79 kg/m.  PHYSICAL EXAMNIATION:  Gen: NAD, conversant, well nourised, well groomed                     Cardiovascular: Regular rate rhythm, no peripheral edema, warm, nontender. Eyes: Conjunctivae clear without exudates or hemorrhage Neck: Supple, no carotid bruits. Pulmonary: Clear to auscultation bilaterally   NEUROLOGICAL EXAM:  MENTAL  STATUS: Speech:    Speech is normal; fluent and spontaneous with normal comprehension.  Cognition:     Orientation to time, place and person     Normal recent and remote memory     Normal Attention span and concentration     Normal Language, naming, repeating,spontaneous speech     Fund of knowledge   CRANIAL NERVES: CN II:  Visual fields are full to confrontation.  Pupils are round equal and briskly reactive to light. CN III, IV, VI: extraocular movement are normal. No ptosis. CN V: Facial sensation is intact to pinprick in all 3 divisions bilaterally. Corneal responses are intact.  CN VII: Face is symmetric with normal eye closure and smile. CN VIII: Hearing is normal to causal conversation. CN IX, X: Palate elevates symmetrically. Phonation is normal. CN XI: Head turning and shoulder shrug are intact CN XII: Tongue is midline with normal movements and no atrophy.  MOTOR: There is no pronator drift of out-stretched arms. Muscle bulk and tone are normal. Muscle strength is normal.  REFLEXES: Reflexes are 2+ and symmetric at the biceps, triceps, knees, and ankles. Plantar responses are flexor.  SENSORY: Intact to light touch, pinprick, positional sensation and vibratory sensation are intact in fingers and toes.  COORDINATION: Rapid alternating movements and fine finger movements are intact. There is no dysmetria on finger-to-nose and heel-knee-shin.    GAIT/STANCE: Posture is normal. Gait is steady with normal steps, base, arm swing, and turning. Heel and toe walking are normal. Tandem gait is normal.  Romberg is absent.   DIAGNOSTIC DATA (LABS, IMAGING, TESTING) - I reviewed patient records, labs, notes, testing and imaging myself where available.   ASSESSMENT AND PLAN  STACIA BRICKETT is a 58 y.o. female    Chronic migraine headaches  Continue nortriptyline, 10 mg titrating to 20 mg every night as preventive medications  Maxalt as needed  Marcial Pacas, M.D.  Ph.D.  [facility] [address], [place], [place] [address] Ph: [phone] Fax: [phone]  CC: Referring Provider

## 2019-06-07 ENCOUNTER — Ambulatory Visit: Payer: BC Managed Care – PPO | Attending: Internal Medicine

## 2019-06-07 DIAGNOSIS — Z20822 Contact with and (suspected) exposure to covid-19: Secondary | ICD-10-CM

## 2019-06-08 LAB — NOVEL CORONAVIRUS, NAA: SARS-CoV-2, NAA: NOT DETECTED

## 2019-07-12 DIAGNOSIS — Z6823 Body mass index (BMI) 23.0-23.9, adult: Secondary | ICD-10-CM | POA: Diagnosis not present

## 2019-07-12 DIAGNOSIS — Z1231 Encounter for screening mammogram for malignant neoplasm of breast: Secondary | ICD-10-CM | POA: Diagnosis not present

## 2019-07-12 DIAGNOSIS — R319 Hematuria, unspecified: Secondary | ICD-10-CM | POA: Diagnosis not present

## 2019-07-12 DIAGNOSIS — Z01419 Encounter for gynecological examination (general) (routine) without abnormal findings: Secondary | ICD-10-CM | POA: Diagnosis not present

## 2019-07-19 DIAGNOSIS — Z905 Acquired absence of kidney: Secondary | ICD-10-CM | POA: Diagnosis not present

## 2019-07-19 DIAGNOSIS — R310 Gross hematuria: Secondary | ICD-10-CM | POA: Diagnosis not present

## 2019-07-19 DIAGNOSIS — D3000 Benign neoplasm of unspecified kidney: Secondary | ICD-10-CM | POA: Diagnosis not present

## 2019-07-20 ENCOUNTER — Other Ambulatory Visit: Payer: Self-pay | Admitting: Urology

## 2019-07-20 DIAGNOSIS — D3000 Benign neoplasm of unspecified kidney: Secondary | ICD-10-CM

## 2019-07-25 ENCOUNTER — Encounter: Payer: Self-pay | Admitting: Podiatry

## 2019-07-25 ENCOUNTER — Other Ambulatory Visit: Payer: Self-pay

## 2019-07-25 ENCOUNTER — Ambulatory Visit: Payer: BC Managed Care – PPO | Admitting: Podiatry

## 2019-07-25 VITALS — Temp 98.0°F

## 2019-07-25 DIAGNOSIS — L600 Ingrowing nail: Secondary | ICD-10-CM | POA: Diagnosis not present

## 2019-07-25 MED ORDER — NEOMYCIN-POLYMYXIN-HC 3.5-10000-1 OT SOLN: OTIC | 1 refills | Status: DC

## 2019-07-25 NOTE — Patient Instructions (Signed)

## 2019-07-26 NOTE — Progress Notes (Signed)
Subjective:   Patient ID: Cristina Sanchez, female   DOB: 59 y.o.   MRN: [id number]   HPI Patient presents stating she has had severe discomfort problems in both big toenails that have been ongoing and have been nothing but a problem for her for the last 10 to 15 years.  States it has been more of an issue over time.  Patient does not smoke likes to be active   Review of Systems  All other systems reviewed and are negative.       Objective:  Physical Exam Vitals and nursing note reviewed.  Constitutional:      Appearance: She is well-developed.  Pulmonary:     Effort: Pulmonary effort is normal.  Musculoskeletal:        General: Normal range of motion.  Skin:    General: Skin is warm.  Neurological:     Mental Status: She is alert.     Neurovascular status intact muscle strength adequate range of motion within normal limits.  Patient is found to have severely thickened hallux nail beds bilateral that are brittle and painful when pressed with the right one being loose secondary to injury with history of these periodically doing this.  Patient has good digital perfusion well oriented x3     Assessment:  Thickened nailbeds hallux bilateral that are painful and make shoe gear difficult with trauma     Plan:  H&P reviewed conditions recommended nail removal and explained procedure risk to patient.  Patient wants surgery understanding risk and today I went ahead and I infiltrated each hallux 60 mg like Marcaine mixture after she had signed consent form I then went ahead and I did sterile prep to the toes and using sterile instrumentation remove the entire hallux nail bilateral exposed root and applied phenol 5 applications 30 seconds followed by alcohol lavage and sterile dressing.  Give instructions on soaks reappoint and leave dressings on 24 hours but take them off earlier if throbbing were to occur and I encouraged her to call with questions concerns

## 2019-08-03 ENCOUNTER — Other Ambulatory Visit: Payer: Self-pay

## 2019-08-03 ENCOUNTER — Ambulatory Visit (HOSPITAL_COMMUNITY)
Admission: RE | Admit: 2019-08-03 | Discharge: 2019-08-03 | Disposition: A | Discharge status: Home / Self Care | Payer: BC Managed Care – PPO | Source: Ambulatory Visit | Attending: Urology | Admitting: Urology

## 2019-08-03 DIAGNOSIS — D3000 Benign neoplasm of unspecified kidney: Secondary | ICD-10-CM | POA: Insufficient documentation

## 2019-08-03 DIAGNOSIS — K7689 Other specified diseases of liver: Secondary | ICD-10-CM | POA: Diagnosis not present

## 2019-08-03 LAB — POCT I-STAT CREATININE: Creatinine, Ser: 0.8 mg/dL (ref 0.44–1.00)

## 2019-08-03 MED ORDER — GADOBUTROL 1 MMOL/ML IV SOLN
7.0000 mL | Freq: Once | INTRAVENOUS | Status: AC | PRN
  Administered 2019-08-03: 10:00:00 7 mL via INTRAVENOUS

## 2019-08-30 DIAGNOSIS — R310 Gross hematuria: Secondary | ICD-10-CM | POA: Diagnosis not present

## 2019-10-06 ENCOUNTER — Ambulatory Visit: Payer: BC Managed Care – PPO | Admitting: Family Medicine

## 2019-10-25 DIAGNOSIS — R194 Change in bowel habit: Secondary | ICD-10-CM | POA: Diagnosis not present

## 2019-10-25 DIAGNOSIS — R140 Abdominal distension (gaseous): Secondary | ICD-10-CM | POA: Diagnosis not present

## 2019-10-25 DIAGNOSIS — K5900 Constipation, unspecified: Secondary | ICD-10-CM | POA: Diagnosis not present

## 2019-11-01 DIAGNOSIS — E7800 Pure hypercholesterolemia, unspecified: Secondary | ICD-10-CM | POA: Diagnosis not present

## 2019-11-01 DIAGNOSIS — Z0000 Encounter for general adult medical examination without abnormal findings: Secondary | ICD-10-CM | POA: Diagnosis not present

## 2019-11-02 ENCOUNTER — Encounter: Payer: Self-pay | Admitting: Family Medicine

## 2019-11-02 ENCOUNTER — Ambulatory Visit: Payer: BC Managed Care – PPO | Admitting: Family Medicine

## 2019-11-02 ENCOUNTER — Other Ambulatory Visit: Payer: Self-pay

## 2019-11-02 VITALS — BP 123/68 | HR 66 | Ht 66.0 in | Wt 158.0 lb

## 2019-11-02 DIAGNOSIS — G43009 Migraine without aura, not intractable, without status migrainosus: Secondary | ICD-10-CM

## 2019-11-02 NOTE — Patient Instructions (Addendum)
We will continue rizatriptan and cyclobenzaprine as needed for abortive therapy.   Stay well hydrated. Well balanced diet and regular exercise.   Follow up in 1 year   Migraine Headache A migraine headache is a very strong throbbing pain on one side or both sides of your head. This type of headache can also cause other symptoms. It can last from 4 hours to 3 days. Talk with your doctor about what things may bring on (trigger) this condition. What are the causes? The exact cause of this condition is not known. This condition may be triggered or caused by:  Drinking alcohol.  Smoking.  Taking medicines, such as: ? Medicine used to treat chest pain (nitroglycerin). ? Birth control pills. ? Estrogen. ? Some blood pressure medicines.  Eating or drinking certain products.  Doing physical activity. Other things that may trigger a migraine headache include:  Having a menstrual period.  Pregnancy.  Hunger.  Stress.  Not getting enough sleep or getting too much sleep.  Weather changes.  Tiredness (fatigue). What increases the risk?  Being 13-88 years old.  Being female.  Having a family history of migraine headaches.  Being Caucasian.  Having depression or anxiety.  Being very overweight. What are the signs or symptoms?  A throbbing pain. This pain may: ? Happen in any area of the head, such as on one side or both sides. ? Make it hard to do daily activities. ? Get worse with physical activity. ? Get worse around bright lights or loud noises.  Other symptoms may include: ? Feeling sick to your stomach (nauseous). ? Vomiting. ? Dizziness. ? Being sensitive to bright lights, loud noises, or smells.  Before you get a migraine headache, you may get warning signs (an aura). An aura may include: ? Seeing flashing lights or having blind spots. ? Seeing bright spots, halos, or zigzag lines. ? Having tunnel vision or blurred vision. ? Having numbness or a tingling  feeling. ? Having trouble talking. ? Having weak muscles.  Some people have symptoms after a migraine headache (postdromal phase), such as: ? Tiredness. ? Trouble thinking (concentrating). How is this treated?  Taking medicines that: ? Relieve pain. ? Relieve the feeling of being sick to your stomach. ? Prevent migraine headaches.  Treatment may also include: ? Having acupuncture. ? Avoiding foods that bring on migraine headaches. ? Learning ways to control your body functions (biofeedback). ? Therapy to help you know and deal with negative thoughts (cognitive behavioral therapy). Follow these instructions at home: Medicines  Take over-the-counter and prescription medicines only as told by your doctor.  Ask your doctor if the medicine prescribed to you: ? Requires you to avoid driving or using heavy machinery. ? Can cause trouble pooping (constipation). You may need to take these steps to prevent or treat trouble pooping:  Drink enough fluid to keep your pee (urine) pale yellow.  Take over-the-counter or prescription medicines.  Eat foods that are high in fiber. These include beans, whole grains, and fresh fruits and vegetables.  Limit foods that are high in fat and sugar. These include fried or sweet foods. Lifestyle  Do not drink alcohol.  Do not use any products that contain nicotine or tobacco, such as cigarettes, e-cigarettes, and chewing tobacco. If you need help quitting, ask your doctor.  Get at least 8 hours of sleep every night.  Limit and deal with stress. General instructions      Keep a journal to find out what may bring  on your migraine headaches. For example, write down: ? What you eat and drink. ? How much sleep you get. ? Any change in what you eat or drink. ? Any change in your medicines.  If you have a migraine headache: ? Avoid things that make your symptoms worse, such as bright lights. ? It may help to lie down in a dark, quiet  room. ? Do not drive or use heavy machinery. ? Ask your doctor what activities are safe for you.  Keep all follow-up visits as told by your doctor. This is important. Contact a doctor if:  You get a migraine headache that is different or worse than others you have had.  You have more than 15 headache days in one month. Get help right away if:  Your migraine headache gets very bad.  Your migraine headache lasts longer than 72 hours.  You have a fever.  You have a stiff neck.  You have trouble seeing.  Your muscles feel weak or like you cannot control them.  You start to lose your balance a lot.  You start to have trouble walking.  You pass out (faint).  You have a seizure. Summary  A migraine headache is a very strong throbbing pain on one side or both sides of your head. These headaches can also cause other symptoms.  This condition may be treated with medicines and changes to your lifestyle.  Keep a journal to find out what may bring on your migraine headaches.  Contact a doctor if you get a migraine headache that is different or worse than others you have had.  Contact your doctor if you have more than 15 headache days in a month. This information is not intended to replace advice given to you by your health care provider. Make sure you discuss any questions you have with your health care provider. Document Revised: 09/17/2018 Document Reviewed: 07/08/2018 Elsevier Patient Education  [place].

## 2019-11-02 NOTE — Progress Notes (Signed)
PATIENT: Cristina Sanchez DOB: [birth year]  REASON FOR VISIT: follow up HISTORY FROM: patient  Chief Complaint  Patient presents with  . Follow-up    Rm 1 here for a f/u on migraines. Pt is not having any new sx     HISTORY OF PRESENT ILLNESS: Today 11/02/19 Cristina Sanchez is a 59 y.o. female here today for follow up for headaches. She took nortriptyline 10mg  for about a week and felt that constipation was much worse. She weaned medication. She tried taking it again around the first of the year and constipation returned. She feels that overall, she is doing ok. Headaches are fairly well managed. She has used CBD and feels that it helps more than anything else she has tried. She is feeling well today and without complaints.   HISTORY: (copied from Dr Rhea Belton note on 04/07/2019)   Cristina Sanchez has been followed up with our clinic for her chronic migraine with aura, also has history of fibromyalgia.  She continue have frequent dizziness spells,  also typical migraine headaches, about once or twice each week, she describes sudden onset transient vertigo sensation, with tinnitus, sometimes associated with her lateralized migraine headaches, light noise sensitivity, nauseous, can lasting for hours to days, sometimes her migraine is proceeded with visual auras,  Her sister, all 3 of her children has typical migraine headaches, sometimes with visual auras  She went through a lot of stress at the end of 2019, lost few family members  UPDATE Apr 07 2019: She continues to have frequent headaches, migraines twice a week, lateralized severe pounding headache with light noise sensitivity, Maxalt works well, takes away her headache in 30 to 60 minutes, she has been taking magnesium oxide and riboflavin, did not try Topamax as prescribed, worried about side effect  She complains a lot of stress,   REVIEW OF SYSTEMS: Out of a complete 14 system review of symptoms, the patient complains only of the  following symptoms, headaches and all other reviewed systems are negative.  ALLERGIES: Allergies  Allergen Reactions  . Nsaids   . Sulfa Antibiotics Hives    dizzness    HOME MEDICATIONS: Outpatient Medications Prior to Visit  Medication Sig Dispense Refill  . Cholecalciferol ([employer] VITAMIN D3 DROPS/INFANT PO) Take 4,000 Units by mouth. Twice weekly    . Cyanocobalamin (VITAMIN B-12 PO) Take by mouth.    . cyclobenzaprine (FLEXERIL) 10 MG tablet Take 1 tablet (10 mg total) by mouth 2 (two) times daily. Muscle spasms 180 tablet 3  . gabapentin (NEURONTIN) 300 MG capsule Take 300 mg by mouth 3 (three) times daily.    . Magnesium 100 MG CAPS Take 600 mg by mouth.    . Melatonin 1 MG TABS Take by mouth.    . Menthol, Topical Analgesic, (BIOFREEZE EX) Apply topically. Biofreeze - up to four times daily.    . Multiple Vitamins-Minerals (MULTIVITAMIN PO) Take by mouth daily.    Marland Kitchen neomycin-polymyxin-hydrocortisone (CORTISPORIN) OTIC solution Apply 1-2 drops to toe after soaking BID 10 mL 1  . NON FORMULARY B Complex Vitamin, [place]    . PENNSAID 2 % SOLN apply ONE pump TO THE affected AREA two TIMES daily AS NEEDED  1  . Riboflavin 400 MG TABS riboflavin (vitamin B2) 400 mg tablet  Take by oral route.    . rizatriptan (MAXALT-MLT) 10 MG disintegrating tablet TAKE 1 TABLET AT ONSET OF MIGRAINE. MAY REPEAT ONCE IN 2 HOURS IF NEEDED. 36 tablet 4  . Turmeric (QC  TUMERIC COMPLEX PO) Take by mouth.    . Ibuprofen (ADVIL) 200 MG CAPS Take by mouth.    . nortriptyline (PAMELOR) 10 MG capsule Take 2 capsules (20 mg total) by mouth at bedtime. 60 capsule 11   No facility-administered medications prior to visit.    PAST MEDICAL HISTORY: Past Medical History:  Diagnosis Date  . Arthritis R hip  . Bursitis of left shoulder    hx of  . Cancer [facility])    kidney cancer  . Chest pain   . Fibromyalgia   . Gastric ulcer    hx of  . GERD (gastroesophageal reflux disease)   . Headache(784.0)  migraines  . Hearing loss    Right   . Migraines   . Rosacea   . Tietze syndrome   . Vitamin D deficiency     PAST SURGICAL HISTORY: Past Surgical History:  Procedure Laterality Date  . ABDOMINAL HYSTERECTOMY  2009  . c section  1997  . CYSTOCELE REPAIR  2007  . D&c x2    . DILATION AND CURETTAGE OF UTERUS  [id number]   x2  . Diliation and [place]    . ESOPHAGOGASTRODUODENOSCOPY  03/24/2012   Procedure: ESOPHAGOGASTRODUODENOSCOPY (EGD);  Surgeon: Milus Banister, MD;  Location: [facility];  Service: Endoscopy;  Laterality: N/A;  . RECTOCELE REPAIR  2007  . ROBOT ASSISTED LAPAROSCOPIC NEPHRECTOMY Left 11/26/2012   Procedure: ROBOTIC ASSISTED LAPAROSCOPIC NEPHRECTOMY;  Surgeon: Alexis Frock, MD;  Location: [facility];  Service: Urology;  Laterality: Left;    FAMILY HISTORY: Family History  Problem Relation Age of Onset  . Dementia Mother   . Cervical cancer Sister   . Kidney cancer Brother     SOCIAL HISTORY: Social History   Socioeconomic History  . Marital status: Married    Spouse name: martin  . Number of children: 3  . Years of education: college  . Highest education level: Not on file  Occupational History    Comment: Home maker  Tobacco Use  . Smoking status: Never Smoker  . Smokeless tobacco: Never Used  Substance and Sexual Activity  . Alcohol use: Yes    Comment: once or twice a year   . Drug use: No  . Sexual activity: Not on file  Other Topics Concern  . Not on file  Social History Narrative   Patient lives at home with her husband.    Patient has 3 children.    Patient is currently a homemaker.    Patient is right handed.          Social Determinants of Health   Financial Resource Strain:   . Difficulty of Paying Living Expenses:   Food Insecurity:   . Worried About Charity fundraiser in the Last Year:   . Arboriculturist in the Last Year:   Transportation Needs:   . Film/video editor (Medical):   Marland Kitchen Lack of Transportation  (Non-Medical):   Physical Activity:   . Days of Exercise per Week:   . Minutes of Exercise per Session:   Stress:   . Feeling of Stress :   Social Connections:   . Frequency of Communication with Friends and Family:   . Frequency of Social Gatherings with Friends and Family:   . Attends Religious Services:   . Active Member of Clubs or Organizations:   . Attends Archivist Meetings:   Marland Kitchen Marital Status:   Intimate Partner Violence:   . Fear of Current  or Ex-Partner:   . Emotionally Abused:   Marland Kitchen Physically Abused:   . Sexually Abused:       PHYSICAL EXAM  Vitals:   11/02/19 1512  BP: 123/68  Pulse: 66  Weight: 158 lb (71.7 kg)  Height: 5\' 6"  (1.676 m)   Body mass index is 25.5 kg/m.  Generalized: Well developed, in no acute distress  Cardiology: normal rate and rhythm, no murmur noted Respiratory: clear to auscultation bilaterally  Neurological examination  Mentation: Alert oriented to time, place, history taking. Follows all commands speech and language fluent Cranial nerve II-XII: Pupils were equal round reactive to light. Extraocular movements were full, visual field were full on confrontational test. Facial sensation and strength were normal. Head turning and shoulder shrug  were normal and symmetric. Motor: The motor testing reveals 5 over 5 strength of all 4 extremities. Good symmetric motor tone is noted throughout.  Gait and station: Gait is normal.  DIAGNOSTIC DATA (LABS, IMAGING, TESTING) - I reviewed patient records, labs, notes, testing and imaging myself where available.  No flowsheet data found.   Lab Results  Component Value Date   WBC 5.0 11/22/2012   HGB 10.5 (L) 11/27/2012   HCT 30.7 (L) 11/27/2012   MCV 91.6 11/22/2012   PLT 330 11/22/2012      Component Value Date/Time   NA 133 (L) 11/29/2012 0430   K 4.3 11/29/2012 0430   CL 99 11/29/2012 0430   CO2 30 11/29/2012 0430   GLUCOSE 109 (H) 11/29/2012 0430   BUN 8 11/29/2012 0430     CREATININE 0.80 08/03/2019 0933   CALCIUM 9.1 11/29/2012 0430   PROT 6.2 03/23/2012 2010   ALBUMIN 3.4 (L) 03/23/2012 2010   AST 14 03/23/2012 2010   ALT 12 03/23/2012 2010   ALKPHOS 44 03/23/2012 2010   BILITOT 0.3 03/23/2012 2010   GFRNONAA 62 (L) 11/29/2012 0430   GFRAA 72 (L) 11/29/2012 0430   No results found for: CHOL, HDL, LDLCALC, LDLDIRECT, TRIG, CHOLHDL No results found for: HGBA1C No results found for: VITAMINB12 No results found for: TSH     ASSESSMENT AND PLAN 58 y.o. year old female  has a past medical history of Arthritis (R hip), Bursitis of left shoulder, Cancer ([facility]), Chest pain, Fibromyalgia, Gastric ulcer, GERD (gastroesophageal reflux disease), Headache(784.0) (migraines), Hearing loss, Migraines, Rosacea, Tietze syndrome, and Vitamin D deficiency. here with     ICD-10-CM   1. Migraine without aura and without status migrainosus, not intractable  G43.009     Cristina Sanchez reports that she is doing fairly well.  Maxalt and cyclobenzaprine help for abortive therapy.  She is also use CBD which has been very helpful in managing headaches.  We will continue current therapy for now.  We have discussed other prevention medications but she is not interested at this time.  She was encouraged to continue healthy lifestyle habits.  Adequate hydration and regular exercise encouraged.  She will follow-up with [place] in 1 year, sooner if needed.  She verbalizes understanding and agreement with this plan.   No orders of the defined types were placed in this encounter.    No orders of the defined types were placed in this encounter.     I spent 15 minutes with the patient. 50% of this time was spent counseling and educating patient on plan of care and medications.    Debbora Presto, FNP-C 11/02/2019, 4:12 PM [facility] [address] [place], [place] [address] [phone]

## 2019-11-08 DIAGNOSIS — Z03818 Encounter for observation for suspected exposure to other biological agents ruled out: Secondary | ICD-10-CM | POA: Diagnosis not present

## 2019-11-11 DIAGNOSIS — R194 Change in bowel habit: Secondary | ICD-10-CM | POA: Diagnosis not present

## 2019-11-11 DIAGNOSIS — K640 First degree hemorrhoids: Secondary | ICD-10-CM | POA: Diagnosis not present

## 2019-12-30 DIAGNOSIS — Z20822 Contact with and (suspected) exposure to covid-19: Secondary | ICD-10-CM | POA: Diagnosis not present

## 2020-01-23 ENCOUNTER — Other Ambulatory Visit: Payer: Self-pay | Admitting: Neurology

## 2020-03-12 DIAGNOSIS — Z20822 Contact with and (suspected) exposure to covid-19: Secondary | ICD-10-CM | POA: Diagnosis not present

## 2020-03-15 DIAGNOSIS — H903 Sensorineural hearing loss, bilateral: Secondary | ICD-10-CM | POA: Diagnosis not present

## 2020-03-15 DIAGNOSIS — H9313 Tinnitus, bilateral: Secondary | ICD-10-CM | POA: Diagnosis not present

## 2020-04-30 NOTE — Progress Notes (Signed)
I have reviewed and agreed above plan.  It her migraine is well controlled, she may continue follow up with her PCP

## 2020-05-08 ENCOUNTER — Other Ambulatory Visit: Payer: Self-pay

## 2020-05-08 ENCOUNTER — Ambulatory Visit (INDEPENDENT_AMBULATORY_CARE_PROVIDER_SITE_OTHER): Payer: BC Managed Care – PPO | Admitting: Family Medicine

## 2020-05-08 ENCOUNTER — Encounter: Payer: Self-pay | Admitting: Family Medicine

## 2020-05-08 VITALS — BP 118/71 | HR 82 | Ht 66.0 in | Wt 150.0 lb

## 2020-05-08 DIAGNOSIS — G43009 Migraine without aura, not intractable, without status migrainosus: Secondary | ICD-10-CM

## 2020-05-08 MED ORDER — GABAPENTIN 300 MG PO CAPS
300.0000 mg | ORAL_CAPSULE | Freq: Every day | ORAL | 3 refills | Status: DC
Start: 2020-05-08 — End: 2020-08-20

## 2020-05-08 NOTE — Patient Instructions (Addendum)
Below is our plan:  We will start a low dose of gabapentin at bedtime to see if this helps headaches. Start 300mg  at bedtime. I would encourage you to follow up with Dr Zadie Rhine if you feel fibromyalgia is worsening. Continue rizatriptan and cyclobenzaprine as needed for abortive therapy.   Please make sure you are staying well hydrated. I recommend 50-60 ounces daily. Well balanced diet and regular exercise encouraged.    Please continue follow up with care team as directed.   Follow up in 6 months   You may receive a survey regarding today's visit. I encourage you to leave honest feed back as I do use this information to improve patient care. Thank you for seeing me today!     Gabapentin capsules or tablets What is this medicine? GABAPENTIN (GA ba pen tin) is used to control seizures in certain types of epilepsy. It is also used to treat certain types of nerve pain. This medicine may be used for other purposes; ask your health care provider or pharmacist if you have questions. COMMON BRAND NAME(S): Active-PAC with Gabapentin, Gabarone, Neurontin What should I tell my health care provider before I take this medicine? They need to know if you have any of these conditions:  history of drug abuse or alcohol abuse problem  kidney disease  lung or breathing disease  suicidal thoughts, plans, or attempt; a previous suicide attempt by you or a family member  an unusual or allergic reaction to gabapentin, other medicines, foods, dyes, or preservatives  pregnant or trying to get pregnant  breast-feeding How should I use this medicine? Take this medicine by mouth with a glass of water. Follow the directions on the prescription label. You can take it with or without food. If it upsets your stomach, take it with food. Take your medicine at regular intervals. Do not take it more often than directed. Do not stop taking except on your doctor's advice. If you are directed to break the 600 or  800 mg tablets in half as part of your dose, the extra half tablet should be used for the next dose. If you have not used the extra half tablet within 28 days, it should be thrown away. A special MedGuide will be given to you by the pharmacist with each prescription and refill. Be sure to read this information carefully each time. Talk to your pediatrician regarding the use of this medicine in children. While this drug may be prescribed for children as young as 3 years for selected conditions, precautions do apply. Overdosage: If you think you have taken too much of this medicine contact a poison control center or emergency room at once. NOTE: This medicine is only for you. Do not share this medicine with others. What if I miss a dose? If you miss a dose, take it as soon as you can. If it is almost time for your next dose, take only that dose. Do not take double or extra doses. What may interact with this medicine? This medicine may interact with the following medications:  alcohol  antihistamines for allergy, cough, and cold  certain medicines for anxiety or sleep  certain medicines for depression like amitriptyline, fluoxetine, sertraline  certain medicines for seizures like phenobarbital, primidone  certain medicines for stomach problems  general anesthetics like halothane, isoflurane, methoxyflurane, propofol  local anesthetics like lidocaine, pramoxine, tetracaine  medicines that relax muscles for surgery  narcotic medicines for pain  phenothiazines like chlorpromazine, mesoridazine, prochlorperazine, thioridazine This list  may not describe all possible interactions. Give your health care provider a list of all the medicines, herbs, non-prescription drugs, or dietary supplements you use. Also tell them if you smoke, drink alcohol, or use illegal drugs. Some items may interact with your medicine. What should I watch for while using this medicine? Visit your doctor or health care  provider for regular checks on your progress. You may want to keep a record at home of how you feel your condition is responding to treatment. You may want to share this information with your doctor or health care provider at each visit. You should contact your doctor or health care provider if your seizures get worse or if you have any new types of seizures. Do not stop taking this medicine or any of your seizure medicines unless instructed by your doctor or health care provider. Stopping your medicine suddenly can increase your seizures or their severity. This medicine may cause serious skin reactions. They can happen weeks to months after starting the medicine. Contact your health care provider right away if you notice fevers or flu-like symptoms with a rash. The rash may be red or purple and then turn into blisters or peeling of the skin. Or, you might notice a red rash with swelling of the face, lips or lymph nodes in your neck or under your arms. Wear a medical identification bracelet or chain if you are taking this medicine for seizures, and carry a card that lists all your medications. You may get drowsy, dizzy, or have blurred vision. Do not drive, use machinery, or do anything that needs mental alertness until you know how this medicine affects you. To reduce dizzy or fainting spells, do not sit or stand up quickly, especially if you are an older patient. Alcohol can increase drowsiness and dizziness. Avoid alcoholic drinks. Your mouth may get dry. Chewing sugarless gum or sucking hard candy, and drinking plenty of water will help. The use of this medicine may increase the chance of suicidal thoughts or actions. Pay special attention to how you are responding while on this medicine. Any worsening of mood, or thoughts of suicide or dying should be reported to your health care provider right away. Women who become pregnant while using this medicine may enroll in the [place]  Pregnancy Registry by calling [phone]. This registry collects information about the safety of antiepileptic drug use during pregnancy. What side effects may I notice from receiving this medicine? Side effects that you should report to your doctor or health care professional as soon as possible:  allergic reactions like skin rash, itching or hives, swelling of the face, lips, or tongue  breathing problems  rash, fever, and swollen lymph nodes  redness, blistering, peeling or loosening of the skin, including inside the mouth  suicidal thoughts, mood changes Side effects that usually do not require medical attention (report to your doctor or health care professional if they continue or are bothersome):  dizziness  drowsiness  headache  nausea, vomiting  swelling of ankles, feet, hands  tiredness This list may not describe all possible side effects. Call your doctor for medical advice about side effects. You may report side effects to FDA at 1-800-FDA-1088. Where should I keep my medicine? Keep out of reach of children. This medicine may cause accidental overdose and death if it taken by other adults, children, or pets. Mix any unused medicine with a substance like cat litter or coffee grounds. Then throw the medicine away in  a sealed container like a sealed bag or a coffee can with a lid. Do not use the medicine after the expiration date. Store at room temperature between 15 and 30 degrees C (59 and 86 degrees F). NOTE: This sheet is a summary. It may not cover all possible information. If you have questions about this medicine, talk to your doctor, pharmacist, or health care provider.  2020 Elsevier/Gold Standard (2018-08-27 14:16:43)    Migraine Headache A migraine headache is a very strong throbbing pain on one side or both sides of your head. This type of headache can also cause other symptoms. It can last from 4 hours to 3 days. Talk with your doctor about what things  may bring on (trigger) this condition. What are the causes? The exact cause of this condition is not known. This condition may be triggered or caused by:  Drinking alcohol.  Smoking.  Taking medicines, such as: ? Medicine used to treat chest pain (nitroglycerin). ? Birth control pills. ? Estrogen. ? Some blood pressure medicines.  Eating or drinking certain products.  Doing physical activity. Other things that may trigger a migraine headache include:  Having a menstrual period.  Pregnancy.  Hunger.  Stress.  Not getting enough sleep or getting too much sleep.  Weather changes.  Tiredness (fatigue). What increases the risk?  Being 41-53 years old.  Being female.  Having a family history of migraine headaches.  Being Caucasian.  Having depression or anxiety.  Being very overweight. What are the signs or symptoms?  A throbbing pain. This pain may: ? Happen in any area of the head, such as on one side or both sides. ? Make it hard to do daily activities. ? Get worse with physical activity. ? Get worse around bright lights or loud noises.  Other symptoms may include: ? Feeling sick to your stomach (nauseous). ? Vomiting. ? Dizziness. ? Being sensitive to bright lights, loud noises, or smells.  Before you get a migraine headache, you may get warning signs (an aura). An aura may include: ? Seeing flashing lights or having blind spots. ? Seeing bright spots, halos, or zigzag lines. ? Having tunnel vision or blurred vision. ? Having numbness or a tingling feeling. ? Having trouble talking. ? Having weak muscles.  Some people have symptoms after a migraine headache (postdromal phase), such as: ? Tiredness. ? Trouble thinking (concentrating). How is this treated?  Taking medicines that: ? Relieve pain. ? Relieve the feeling of being sick to your stomach. ? Prevent migraine headaches.  Treatment may also include: ? Having acupuncture. ? Avoiding  foods that bring on migraine headaches. ? Learning ways to control your body functions (biofeedback). ? Therapy to help you know and deal with negative thoughts (cognitive behavioral therapy). Follow these instructions at home: Medicines  Take over-the-counter and prescription medicines only as told by your doctor.  Ask your doctor if the medicine prescribed to you: ? Requires you to avoid driving or using heavy machinery. ? Can cause trouble pooping (constipation). You may need to take these steps to prevent or treat trouble pooping:  Drink enough fluid to keep your pee (urine) pale yellow.  Take over-the-counter or prescription medicines.  Eat foods that are high in fiber. These include beans, whole grains, and fresh fruits and vegetables.  Limit foods that are high in fat and sugar. These include fried or sweet foods. Lifestyle  Do not drink alcohol.  Do not use any products that contain nicotine or tobacco, such  as cigarettes, e-cigarettes, and chewing tobacco. If you need help quitting, ask your doctor.  Get at least 8 hours of sleep every night.  Limit and deal with stress. General instructions      Keep a journal to find out what may bring on your migraine headaches. For example, write down: ? What you eat and drink. ? How much sleep you get. ? Any change in what you eat or drink. ? Any change in your medicines.  If you have a migraine headache: ? Avoid things that make your symptoms worse, such as bright lights. ? It may help to lie down in a dark, quiet room. ? Do not drive or use heavy machinery. ? Ask your doctor what activities are safe for you.  Keep all follow-up visits as told by your doctor. This is important. Contact a doctor if:  You get a migraine headache that is different or worse than others you have had.  You have more than 15 headache days in one month. Get help right away if:  Your migraine headache gets very bad.  Your migraine headache  lasts longer than 72 hours.  You have a fever.  You have a stiff neck.  You have trouble seeing.  Your muscles feel weak or like you cannot control them.  You start to lose your balance a lot.  You start to have trouble walking.  You pass out (faint).  You have a seizure. Summary  A migraine headache is a very strong throbbing pain on one side or both sides of your head. These headaches can also cause other symptoms.  This condition may be treated with medicines and changes to your lifestyle.  Keep a journal to find out what may bring on your migraine headaches.  Contact a doctor if you get a migraine headache that is different or worse than others you have had.  Contact your doctor if you have more than 15 headache days in a month. This information is not intended to replace advice given to you by your health care provider. Make sure you discuss any questions you have with your health care provider. Document Revised: 09/17/2018 Document Reviewed: 07/08/2018 Elsevier Patient Education  [place].

## 2020-05-08 NOTE — Progress Notes (Signed)
Chief Complaint  Patient presents with  . Migraine    rm 5- it says she has questions about her fibromyalgia     HISTORY OF PRESENT ILLNESS: Today 05/08/20  Cristina Sanchez is a 59 y.o. female here today for follow up for migraines. She has continues rizatriptan and cyclobenzaprine for abortive therapy. She may be getting a few more headaches than usual. Has not tolerated nortriptyline. She was previously taking gabapentin for fibromyalgia management (can also help with migraine prevention) but reports that she weaned herself off of this medication. She reports that she hasn't taken it in about 3 years. She felt that it helped significantly with "skin pain". She is more tired than usual, especially after eating carbs. She eats a vegan diet. She [place] was managing fibromyalgia pain. She is now followed by PCP. She uses CBD oil prn that may help a little. She is doing water aerobics about three times a week.    HISTORY (copied from previous note)  Cristina Sanchez is a 59 y.o. female here today for follow up for headaches. She took nortriptyline 10mg  for about a week and felt that constipation was much worse. She weaned medication. She tried taking it again around the first of the year and constipation returned. She feels that overall, she is doing ok. Headaches are fairly well managed. She has used CBD and feels that it helps more than anything else she has tried. She is feeling well today and without complaints.   HISTORY: (copied from Dr Rhea Belton note on 04/07/2019)  Cristina Sanchez has been followed up with our clinic for her chronic migraine with aura, also has history of fibromyalgia.  She continue have frequent dizziness spells,alsotypical migraine headaches, about once or twice each week, she describes sudden onset transient vertigo sensation, with tinnitus, sometimes associated with her lateralized migraine headaches, light noise sensitivity,  nauseous, can lasting for hours to days, sometimes her migraine is proceeded with visual auras,  Her sister, all 3 of her children has typical migraine headaches, sometimes with visual auras  She went through a lot of stress at the end of 2019, lost few family members  UPDATE Oct29 2020: She continues to have frequent headaches, migraines twice a week, lateralized severe pounding headache with light noise sensitivity, Maxalt works well, takes away her headache in 30 to 60 minutes, she has been taking magnesium oxide and riboflavin, did not try Topamax as prescribed, worried about side effect  She complains a lot of stress,   REVIEW OF SYSTEMS: Out of a complete 14 system review of symptoms, the patient complains only of the following symptoms, headaches, fatigue, pain and all other reviewed systems are negative.   ALLERGIES: Allergies  Allergen Reactions  . Nsaids   . Sulfa Antibiotics Hives    dizzness     HOME MEDICATIONS: Outpatient Medications Prior to Visit  Medication Sig Dispense Refill  . Ascorbic Acid (VITAMIN C) 1000 MG tablet Take 1,000 mg by mouth daily.    Marland Kitchen b complex vitamins capsule Take 1 capsule by mouth daily.    Azucena Freed Serrata (BOSWELLIA PO) Take by mouth.    . Cholecalciferol ([employer] VITAMIN D3 DROPS/INFANT PO) Take 4,000 Units by mouth. Twice weekly    . Coenzyme Q10 (COQ-10 PO) Take by mouth.    . Cyanocobalamin (VITAMIN B-12 PO) Take 500 mg by mouth.     . cyclobenzaprine (FLEXERIL) 10 MG tablet Take 1 tablet (10  mg total) by mouth 2 (two) times daily. Muscle spasms 180 tablet 3  . Ginger, Zingiber officinalis, (GINGER PO) Take by mouth.    . Ginkgo Biloba (GINKOBA PO) Take by mouth.    . Magnesium 100 MG CAPS Take 600 mg by mouth.    . Melatonin 1 MG TABS Take 3 mg by mouth.     . Menthol, Topical Analgesic, (BIOFREEZE EX) Apply topically. Biofreeze - up to four times daily.    . Multiple Vitamins-Minerals (MULTIVITAMIN PO) Take by mouth daily.     . NON FORMULARY B Complex Vitamin, Boswelia    . PENNSAID 2 % SOLN apply ONE pump TO THE affected AREA two TIMES daily AS NEEDED  1  . Probiotic Product (PROBIOTIC DAILY PO) Take by mouth.    . Riboflavin 400 MG TABS riboflavin (vitamin B2) 400 mg tablet  Take by oral route.    . rizatriptan (MAXALT-MLT) 10 MG disintegrating tablet PLACE 1 TABLET ON TONGUE AT ONSET OF MIGRAINE, MAY REPEAT ONCE IN 2 HOURS IF NEEDED 36 tablet 7  . Turmeric (QC TUMERIC COMPLEX PO) Take by mouth.    . gabapentin (NEURONTIN) 300 MG capsule Take 300 mg by mouth 3 (three) times daily. (Patient not taking: Reported on 05/08/2020)    . neomycin-polymyxin-hydrocortisone (CORTISPORIN) OTIC solution Apply 1-2 drops to toe after soaking BID 10 mL 1   No facility-administered medications prior to visit.     PAST MEDICAL HISTORY: Past Medical History:  Diagnosis Date  . Arthritis R hip  . Bursitis of left shoulder    hx of  . Cancer [facility])    kidney cancer  . Chest pain   . Fibromyalgia   . Gastric ulcer    hx of  . GERD (gastroesophageal reflux disease)   . Headache(784.0) migraines  . Hearing loss    Right   . Migraines   . Rosacea   . Tietze syndrome   . Vitamin D deficiency      PAST SURGICAL HISTORY: Past Surgical History:  Procedure Laterality Date  . ABDOMINAL HYSTERECTOMY  2009  . c section  1997  . CYSTOCELE REPAIR  2007  . D&c x2    . DILATION AND CURETTAGE OF UTERUS  [id number],6073   x2  . Diliation and Coutar    . ESOPHAGOGASTRODUODENOSCOPY  03/24/2012   Procedure: ESOPHAGOGASTRODUODENOSCOPY (EGD);  Surgeon: Milus Banister, MD;  Location: [facility];  Service: Endoscopy;  Laterality: N/A;  . RECTOCELE REPAIR  2007  . ROBOT ASSISTED LAPAROSCOPIC NEPHRECTOMY Left 11/26/2012   Procedure: ROBOTIC ASSISTED LAPAROSCOPIC NEPHRECTOMY;  Surgeon: Alexis Frock, MD;  Location: [facility];  Service: Urology;  Laterality: Left;     FAMILY HISTORY: Family History  Problem Relation Age of Onset  .  Dementia Mother   . Cervical cancer Sister   . Kidney cancer Brother      SOCIAL HISTORY: Social History   Socioeconomic History  . Marital status: Married    Spouse name: martin  . Number of children: 3  . Years of education: college  . Highest education level: Not on file  Occupational History    Comment: Home maker  Tobacco Use  . Smoking status: Never Smoker  . Smokeless tobacco: Never Used  Substance and Sexual Activity  . Alcohol use: Yes    Comment: once or twice a year   . Drug use: No  . Sexual activity: Not on file  Other Topics Concern  . Not on file  Social  History Narrative   Patient lives at home with her husband.    Patient has 3 children.    Patient is currently a homemaker.    Patient is right handed.          Social Determinants of Health   Financial Resource Strain:   . Difficulty of Paying Living Expenses: Not on file  Food Insecurity:   . Worried About Charity fundraiser in the Last Year: Not on file  . Ran Out of Food in the Last Year: Not on file  Transportation Needs:   . Lack of Transportation (Medical): Not on file  . Lack of Transportation (Non-Medical): Not on file  Physical Activity:   . Days of Exercise per Week: Not on file  . Minutes of Exercise per Session: Not on file  Stress:   . Feeling of Stress : Not on file  Social Connections:   . Frequency of Communication with Friends and Family: Not on file  . Frequency of Social Gatherings with Friends and Family: Not on file  . Attends Religious Services: Not on file  . Active Member of Clubs or Organizations: Not on file  . Attends Archivist Meetings: Not on file  . Marital Status: Not on file  Intimate Partner Violence:   . Fear of Current or Ex-Partner: Not on file  . Emotionally Abused: Not on file  . Physically Abused: Not on file  . Sexually Abused: Not on file      PHYSICAL EXAM  Vitals:   05/08/20 1456  BP: 118/71  Pulse: 82  Weight: 150 lb (68 kg)   Height: 5\' 6"  (1.676 m)   Body mass index is 24.21 kg/m.   Generalized: Well developed, in no acute distress  Cardiology: normal rate and rhythm, no murmur auscultated  Respiratory: clear to auscultation bilaterally    Neurological examination  Mentation: Alert oriented to time, place, history taking. Follows all commands speech and language fluent Cranial nerve II-XII: Pupils were equal round reactive to light. Extraocular movements were full, visual field were full on confrontational test. Facial sensation and strength were normal. Head turning and shoulder shrug  were normal and symmetric. Motor: The motor testing reveals 5 over 5 strength of all 4 extremities. Good symmetric motor tone is noted throughout.  Sensory: Sensory testing is intact to soft touch on all 4 extremities. No evidence of extinction is noted.  Gait and station: Gait is normal. Reflexes: Deep tendon reflexes are symmetric and normal bilaterally.     DIAGNOSTIC DATA (LABS, IMAGING, TESTING) - I reviewed patient records, labs, notes, testing and imaging myself where available.  Lab Results  Component Value Date   WBC 5.0 11/22/2012   HGB 10.5 (L) 11/27/2012   HCT 30.7 (L) 11/27/2012   MCV 91.6 11/22/2012   PLT 330 11/22/2012      Component Value Date/Time   NA 133 (L) 11/29/2012 0430   K 4.3 11/29/2012 0430   CL 99 11/29/2012 0430   CO2 30 11/29/2012 0430   GLUCOSE 109 (H) 11/29/2012 0430   BUN 8 11/29/2012 0430   CREATININE 0.80 08/03/2019 0933   CALCIUM 9.1 11/29/2012 0430   PROT 6.2 03/23/2012 2010   ALBUMIN 3.4 (L) 03/23/2012 2010   AST 14 03/23/2012 2010   ALT 12 03/23/2012 2010   ALKPHOS 44 03/23/2012 2010   BILITOT 0.3 03/23/2012 2010   GFRNONAA 62 (L) 11/29/2012 0430   GFRAA 72 (L) 11/29/2012 0430   No results found  for: CHOL, HDL, LDLCALC, LDLDIRECT, TRIG, CHOLHDL No results found for: HGBA1C No results found for: VITAMINB12 No results found for: TSH    ASSESSMENT AND  PLAN  59 y.o. year old female  has a past medical history of Arthritis (R hip), Bursitis of left shoulder, Cancer ([place]), Chest pain, Fibromyalgia, Gastric ulcer, GERD (gastroesophageal reflux disease), Headache(784.0) (migraines), Hearing loss, Migraines, Rosacea, Tietze syndrome, and Vitamin D deficiency. here with   Migraine without aura and without status migrainosus, not intractable  Damari has noticed increased frequency and intensity of headaches over the past few months.  She is also experiencing more skin pain that she feels is related to fibromyalgia.  She is requesting to restart gabapentin.  Gabapentin can be helpful in migraine management, therefore, we will start 300 mg at bedtime.  I have encouraged her to follow-up closely with her primary care provider for management of fibromyalgia should skin pain or fatigue worsen.  Healthy lifestyle habits encouraged.  She will follow-up in 6 months, sooner if needed.  She verbalizes understanding and agreement with this plan.   I spent 20 minutes of face-to-face and non-face-to-face time with patient.  This included previsit chart review, lab review, study review, order entry, electronic health record documentation, patient education.    Debbora Presto, MSN, FNP-C 05/08/2020, 3:02 PM  [facility] [address] [place], [place] [address] [phone]

## 2020-05-21 DIAGNOSIS — M797 Fibromyalgia: Secondary | ICD-10-CM | POA: Diagnosis not present

## 2020-05-21 IMAGING — MR MR ABDOMEN WO/W CM
18 series · 48 of 48 positions shown · IV contrast (gadavist)
Comparison: Abdomen and pelvis CT 04/18/2016

CLINICAL DATA: History of laparoscopic left nephrectomy for benign
neoplasm.

EXAM:
MRI ABDOMEN WITHOUT AND WITH CONTRAST
TECHNIQUE: Multiplanar multisequence MR imaging of the abdomen was performed
both before and after the administration of intravenous contrast.
CONTRAST:  7mL GADAVIST GADOBUTROL 1 MMOL/ML IV SOLN

[Series 2: T2 · coronal · 6.0mm · 1.56mm/px · 2 of 30 slices shown (1 of 2)]
[im 1/30]
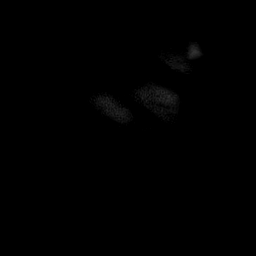
[im 30/30]
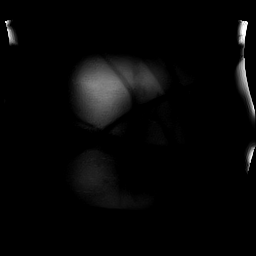

[Series 4: T2 fat-sat · axial · 6.0mm · 1.16mm/px · z∈[-96,+156]mm · 2 of 36 slices shown]
[im 1/36]
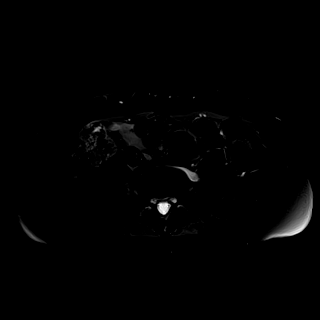
[im 36/36]
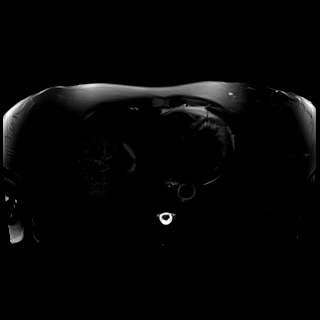

[Series 5: T1 · axial · 3.0mm · 1.16mm/px · z∈[-106,+155]mm · 4 of 88 slices shown (1 of 2)]
[im 1/88]
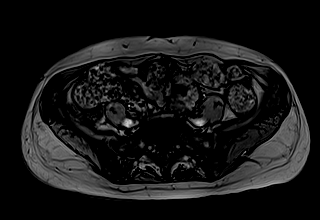
[im 30/88]
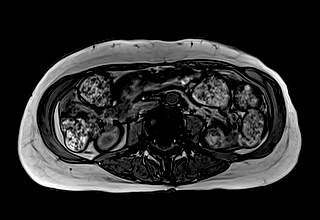
[im 59/88]
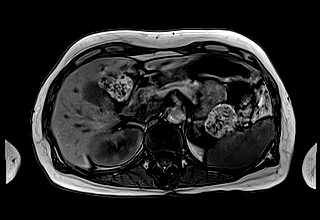
[im 88/88]
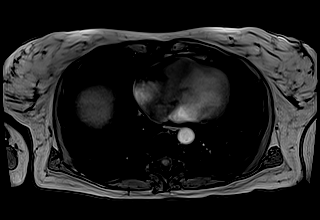

[Series 6: T1 · axial · 3.0mm · 1.16mm/px · z∈[-106,+155]mm · 3 of 88 slices shown (2 of 2)]
[im 1/88]
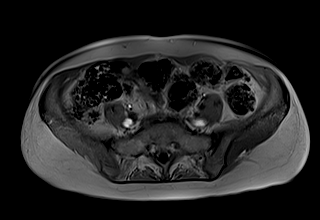
[im 44/88]
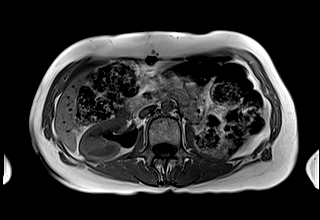
[im 88/88]
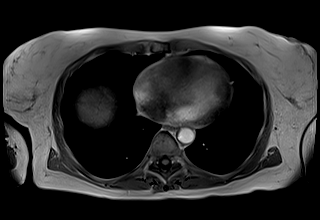

[Series 7: DWI · axial · 6.0mm · 1.49mm/px · z∈[-105,+147]mm · 3 of 72 slices shown (1 of 2)]
[im 1/72]
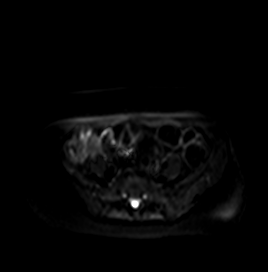
[im 36/72]
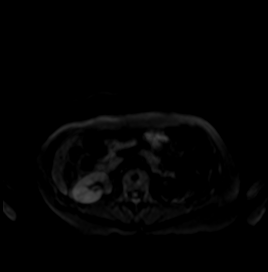
[im 72/72]
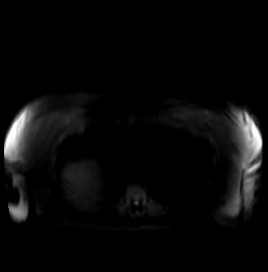

[Series 8: DWI · axial · 6.0mm · 1.49mm/px · 1 of 36 slices shown (2 of 2)]
[im 1/36]
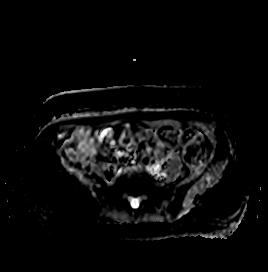

[Series 9: bSSFP · axial · 5.0mm · 0.84mm/px · z∈[-108,+162]mm · 2 of 50 slices shown]
[im 1/50]
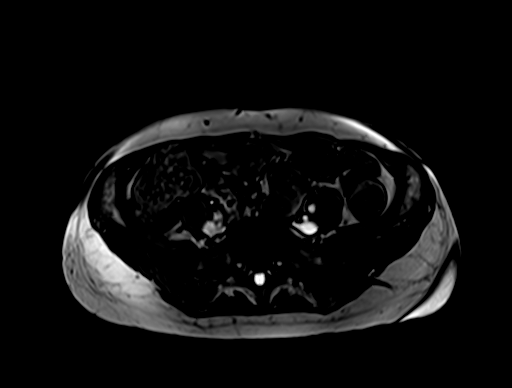
[im 50/50]
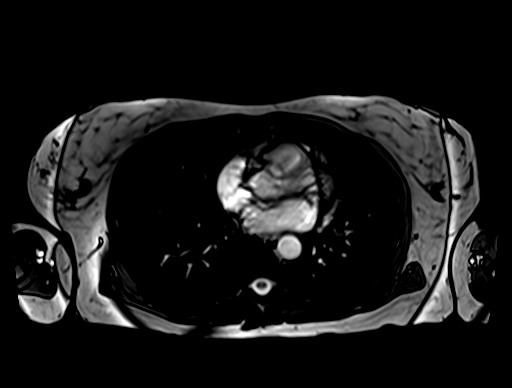

[Series 11: T1 dynamic · axial · 3.0mm · 1.16mm/px · z∈[-93,+144]mm · 3 of 80 slices shown (1 of 10)]
[im 1/80]
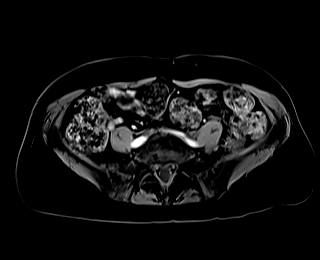
[im 40/80]
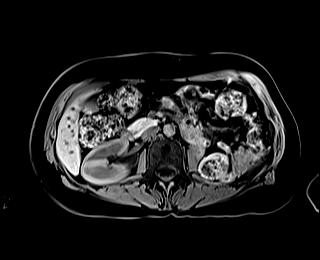
[im 80/80]
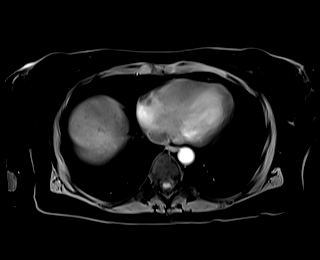

[Series 15: T1 dynamic · axial · 3.0mm · 1.16mm/px · z∈[-93,+144]mm · 3 of 80 slices shown (2 of 10)]
[im 1/80]
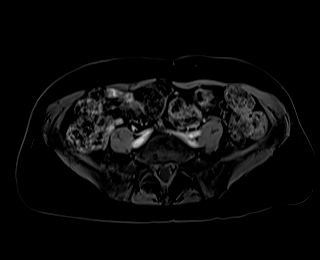
[im 40/80]
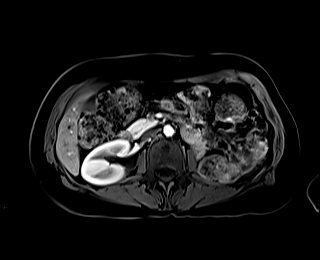
[im 80/80]
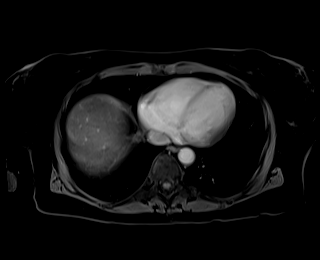

[Series 16: T1 dynamic · axial · 3.0mm · 1.16mm/px · z∈[-93,+144]mm · 3 of 80 slices shown (3 of 10)]
[im 1/80]
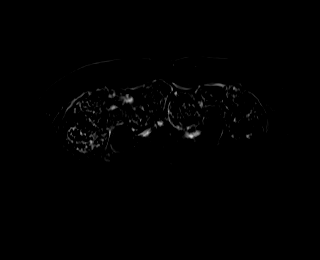
[im 40/80]
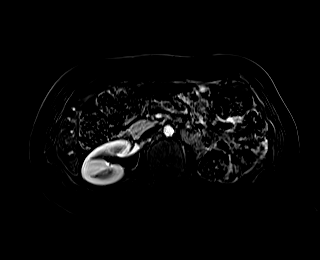
[im 80/80]
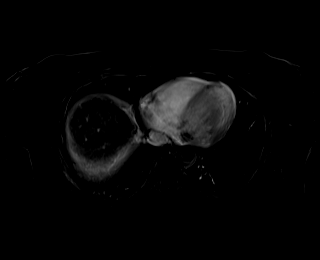

[Series 19: T1 dynamic · axial · 3.0mm · 1.16mm/px · z∈[-93,+144]mm · 3 of 80 slices shown (4 of 10)]
[im 1/80]
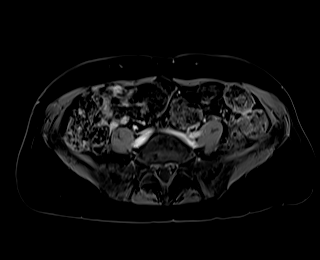
[im 40/80]
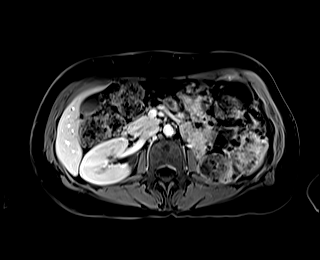
[im 80/80]
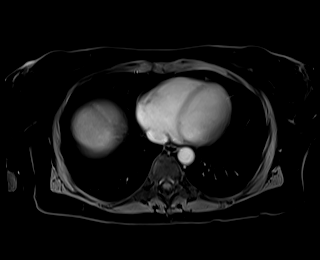

[Series 20: T1 dynamic · axial · 3.0mm · 1.16mm/px · z∈[-93,+144]mm · 3 of 80 slices shown (5 of 10)]
[im 1/80]
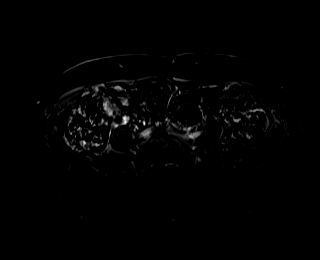
[im 40/80]
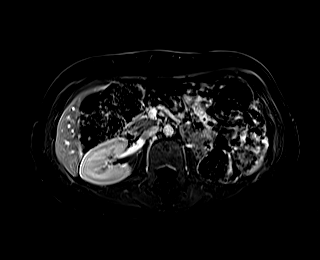
[im 80/80]
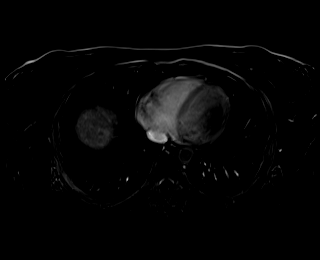

[Series 23: T1 dynamic · axial · 3.0mm · 1.16mm/px · z∈[-93,+144]mm · 3 of 80 slices shown (6 of 10)]
[im 1/80]
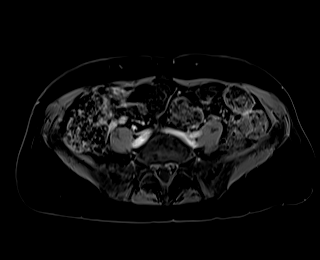
[im 40/80]
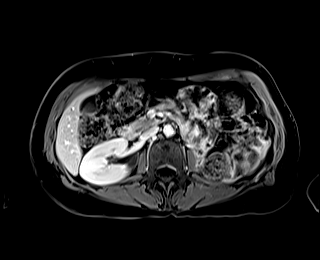
[im 80/80]
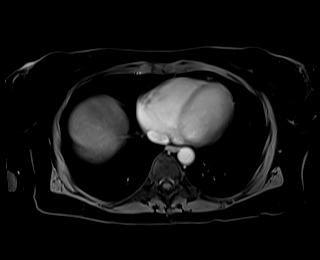

[Series 24: T1 dynamic · axial · 3.0mm · 1.16mm/px · z∈[-93,+144]mm · 3 of 80 slices shown (7 of 10)]
[im 1/80]
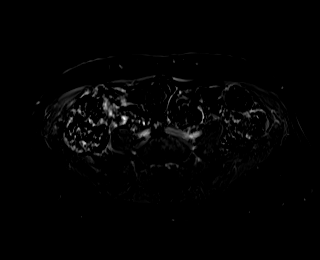
[im 40/80]
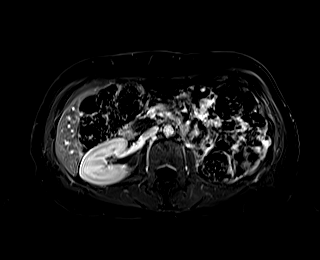
[im 80/80]
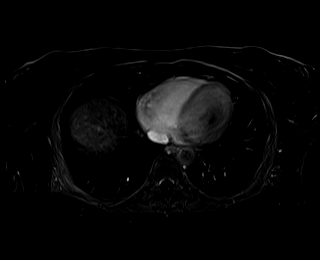

[Series 26: T1 dynamic · coronal · 3.0mm · 1.31mm/px · 3 of 72 slices shown (8 of 10)]
[im 1/72]
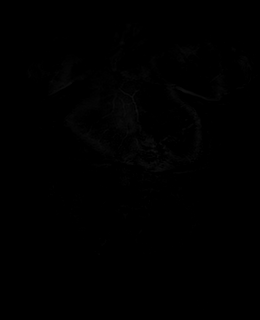
[im 36/72]
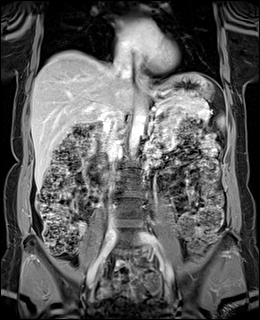
[im 72/72]
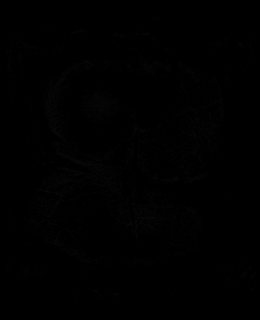

[Series 27: T2 · axial · 6.0mm · 1.45mm/px · 1 of 37 slices shown (2 of 2)]
[im 1/37]
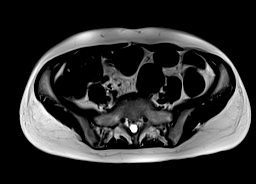

[Series 30: T1 dynamic · axial · 3.0mm · 1.16mm/px · z∈[-93,+144]mm · 3 of 80 slices shown (9 of 10)]
[im 1/80]
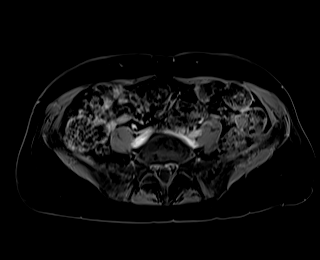
[im 40/80]
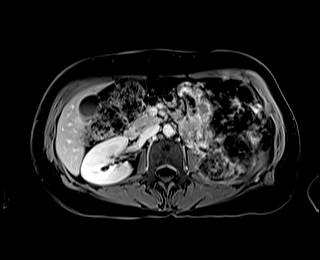
[im 80/80]
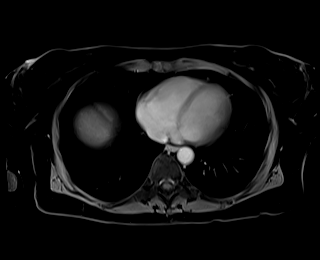

[Series 31: T1 dynamic · axial · 3.0mm · 1.16mm/px · z∈[-93,+144]mm · 3 of 80 slices shown (10 of 10)]
[im 1/80]
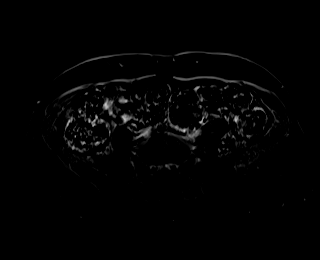
[im 40/80]
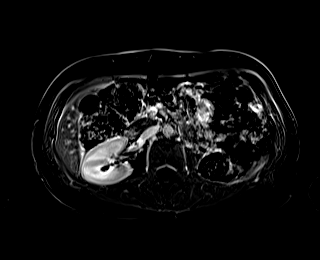
[im 80/80]
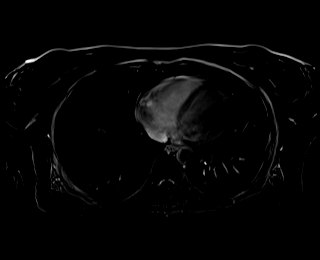

[48 of 48 positions shown; findings below may reference images not displayed]

FINDINGS: Lower chest: Unremarkable

Hepatobiliary: Scattered tiny cysts are noted in the liver
parenchyma. There is no evidence for gallstones, gallbladder wall
thickening, or pericholecystic fluid. No intrahepatic or
extrahepatic biliary dilation.

Pancreas: No focal mass lesion. No dilatation of the main duct. No
intraparenchymal cyst. No peripancreatic edema.

Spleen:  No splenomegaly. No focal mass lesion.

Adrenals/Urinary Tract: No adrenal nodule or mass. Left kidney
surgically absent. No suspicious soft tissue in the left nephrectomy
bed. No suspicious lesion in the right kidney. No right
hydronephrosis.

Stomach/Bowel: Gastric fundal diverticulum noted. Stomach otherwise
unremarkable. No gastric wall thickening. No evidence of outlet
obstruction. Duodenum is normally positioned as is the ligament of
Treitz. No small bowel dilatation within the visualized abdomen.
Moderate stool volume noted throughout the colonic segments of the
abdomen.

Vascular/Lymphatic: No abdominal aortic aneurysm. No abdominal
lymphadenopathy. Portal vein is patent.

Other:  No intraperitoneal free fluid.

Musculoskeletal: No abnormal marrow enhancement within the
visualized bony anatomy.
IMPRESSION: Left nephrectomy. No new or suspicious findings in the left
nephrectomy bed. Right kidney unremarkable on today's MRI.

Tiny hepatic cysts.

Prominent stool volume within the visualized colon.

## 2020-05-24 DIAGNOSIS — H903 Sensorineural hearing loss, bilateral: Secondary | ICD-10-CM | POA: Diagnosis not present

## 2020-07-03 DIAGNOSIS — N939 Abnormal uterine and vaginal bleeding, unspecified: Secondary | ICD-10-CM | POA: Diagnosis not present

## 2020-07-03 DIAGNOSIS — S31512A Laceration without foreign body of unspecified external genital organs, female, initial encounter: Secondary | ICD-10-CM | POA: Diagnosis not present

## 2020-07-12 NOTE — Progress Notes (Signed)
I have reviewed and agreed above plan. 

## 2020-07-18 NOTE — Progress Notes (Signed)
Office Visit Note  Patient: Cristina Sanchez             Date of Birth: 1960-10-25           MRN: [id number]             PCP: Aretta Nip, MD Referring: Aretta Nip, MD Visit Date: 08/01/2020 Occupation: @GUAROCC @  Subjective:  Pain in all the joints and muscles.   History of Present Illness: [name] is a 60 y.o. female seen in consultation per request of her PCP.  According to patient 1995 after the childbirth she started having increased dizziness which gradually improved.  Over time she started experiencing increased fatigue and generalized pain all over in her joints and muscles.  She states she started having burning sensation in her extremities.  She tried a lot of over-the-counter topical agents without much relief.  She saw several physicians over the past several years.  Her PCP suspected that she had rheumatoid arthritis.  She was evaluated by rheumatologist in 2004.  She states after the evaluation by her rheumatologist or her joints became so sore because he pressed very hard on her joints.  He diagnosed her with fibromyalgia syndrome and later her PCP also diagnosed her with fibromyalgia syndrome.  She is also seen several neurologists over the years.  She has history of migraine headaches.  She states she had a prescription for Flexeril for many years which she takes on as needed basis.  She also has a prescription for gabapentin now which was given to her for migraines.  She states the gabapentin helped her migraines and also helped with generalized achiness and pain.  She is still continues to have fatigue and discomfort.  She states even after sleeping through the night she does not feel rested in the morning.  She describes pain in her neck, shoulders, elbows, hands, hips, knees, ankles and her feet.  She has not seen any joint swelling.  She states she has been under a lot of stress lately she lost her father-in-law and her sister and her cat in the last few  months.  She has been experiencing increasing stress and increased migraines.  There is no history of oral ulcers, nasal ulcers, malar rash, photosensitivity, lymphadenopathy or inflammatory arthritis.  She gives history of dry eyes and possible Raynaud's.  There is no family history of autoimmune disease.  Her son has orders Danlos syndrome per patient.  Activities of Daily Living:  Patient reports morning stiffness for 2  hours.    Patient Reports nocturnal pain.  Difficulty dressing/grooming: Denies Difficulty climbing stairs: Denies Difficulty getting out of chair: Denies Difficulty using hands for taps, buttons, cutlery, and/or writing: Reports  Review of Systems  Constitutional: Positive for fatigue. Negative for night sweats, weight gain and weight loss.  HENT: Negative for mouth sores, trouble swallowing, trouble swallowing, mouth dryness and nose dryness.   Eyes: Positive for dryness. Negative for pain, redness, itching and visual disturbance.  Respiratory: Negative for cough, shortness of breath and difficulty breathing.        With exertion   Cardiovascular: Negative for chest pain, palpitations, hypertension, irregular heartbeat and swelling in legs/feet.  Gastrointestinal: Positive for constipation. Negative for blood in stool and diarrhea.  Endocrine: Negative for increased urination.  Genitourinary: Negative for difficulty urinating and vaginal dryness.  Musculoskeletal: Positive for arthralgias, joint pain, joint swelling, myalgias, morning stiffness, muscle tenderness and myalgias. Negative for muscle weakness.  Skin: Positive for  color change. Negative for rash, hair loss, redness, skin tightness, ulcers and sensitivity to sunlight.  Allergic/Immunologic: Negative for susceptible to infections.  Neurological: Positive for dizziness, numbness and headaches. Negative for memory loss, night sweats and weakness.  Hematological: Negative for bruising/bleeding tendency and swollen  glands.  Psychiatric/Behavioral: Negative for depressed mood, confusion and sleep disturbance. The patient is not nervous/anxious.     PMFS History:  Patient Active Problem List   Diagnosis Date Noted  . Migraine without aura and without status migrainosus, not intractable 04/07/2019  . Gastric ulcer 03/24/2012  . Esophagitis 03/24/2012  . GI bleed 03/23/2012  . Anemia 03/23/2012  . Migraines 03/23/2012  . Fibromyalgia 03/23/2012    Past Medical History:  Diagnosis Date  . Arthritis R hip  . Bursitis of left shoulder    hx of  . Cancer [facility])    kidney cancer  . Chest pain   . Fibromyalgia   . Gastric ulcer    hx of  . GERD (gastroesophageal reflux disease)   . Headache(784.0) migraines  . Hearing loss    Right   . Migraines   . Rosacea   . Tietze syndrome   . Vitamin D deficiency     Family History  Problem Relation Age of Onset  . Dementia Mother   . Heart attack Father   . Kidney cancer Brother   . Cervical cancer Sister    Past Surgical History:  Procedure Laterality Date  . ABDOMINAL HYSTERECTOMY  2009  . c section  1997  . CYSTOCELE REPAIR  2007  . D&c x2    . DILATION AND CURETTAGE OF UTERUS  9937,1696   x2  . Diliation and Coutar    . ESOPHAGOGASTRODUODENOSCOPY  03/24/2012   Procedure: ESOPHAGOGASTRODUODENOSCOPY (EGD);  Surgeon: Milus Banister, MD;  Location: [facility];  Service: Endoscopy;  Laterality: N/A;  . RECTOCELE REPAIR  2007  . ROBOT ASSISTED LAPAROSCOPIC NEPHRECTOMY Left 11/26/2012   Procedure: ROBOTIC ASSISTED LAPAROSCOPIC NEPHRECTOMY;  Surgeon: Alexis Frock, MD;  Location: [facility];  Service: Urology;  Laterality: Left;   Social History   Social History Narrative   Patient lives at home with her husband.    Patient has 3 children.    Patient is currently a homemaker.    Patient is right handed.          Immunization History  Administered Date(s) Administered  . Moderna Sars-Covid-2 Vaccination 06/04/2020  . PFIZER(Purple  Top)SARS-COV-2 Vaccination 09/01/2019, 09/26/2019     Objective: Vital Signs: BP 128/80 (BP Location: Right Arm, Patient Position: Sitting, Cuff Size: Normal)   Pulse 86   Resp 14   Ht 5\' 6"  (1.676 m)   Wt 149 lb (67.6 kg)   BMI 24.05 kg/m    Physical Exam Vitals and nursing note reviewed.  Constitutional:      Appearance: She is well-developed and well-nourished.  HENT:     Head: Normocephalic and atraumatic.  Eyes:     Extraocular Movements: EOM normal.     Conjunctiva/sclera: Conjunctivae normal.  Cardiovascular:     Rate and Rhythm: Normal rate and regular rhythm.     Pulses: Intact distal pulses.     Heart sounds: Normal heart sounds.  Pulmonary:     Effort: Pulmonary effort is normal.     Breath sounds: Normal breath sounds.  Abdominal:     General: Bowel sounds are normal.     Palpations: Abdomen is soft.  Musculoskeletal:     Cervical back: Normal  range of motion.  Lymphadenopathy:     Cervical: No cervical adenopathy.  Skin:    General: Skin is warm and dry.     Capillary Refill: Capillary refill takes less than 2 seconds.  Neurological:     Mental Status: She is alert and oriented to person, place, and time.  Psychiatric:        Mood and Affect: Mood and affect normal.        Behavior: Behavior normal.        Thought Content: Thought content normal.      Musculoskeletal Exam: C-spine, thoracic and lumbar spine were in good range of motion.  She had no SI joint tenderness.  Shoulder joints, elbow joints, wrist joints, MCPs PIPs and DIPs with good range of motion with no synovitis.  Hip joints, knee joints, ankles, MTPs and PIPs with good range of motion.  She has bilateral first MTP thickening but no synovitis was noted.  She had generalized hyperalgesia and tender points.  She had tenderness on palpation of her bilateral trapezius, bilateral lateral epicondyle, bilateral trochanteric, and bilateral gluteal region.  CDAI Exam: CDAI Score: -- Patient Global:  --; Provider Global: -- Swollen: --; Tender: -- Joint Exam 08/01/2020   No joint exam has been documented for this visit   There is currently no information documented on the homunculus. Go to the Rheumatology activity and complete the homunculus joint exam.  Investigation: No additional findings.  Imaging: No results found.  Recent Labs: Lab Results  Component Value Date   WBC 5.0 11/22/2012   HGB 10.5 (L) 11/27/2012   PLT 330 11/22/2012   NA 133 (L) 11/29/2012   K 4.3 11/29/2012   CL 99 11/29/2012   CO2 30 11/29/2012   GLUCOSE 109 (H) 11/29/2012   BUN 8 11/29/2012   CREATININE 0.80 08/03/2019   BILITOT 0.3 03/23/2012   ALKPHOS 44 03/23/2012   AST 14 03/23/2012   ALT 12 03/23/2012   PROT 6.2 03/23/2012   ALBUMIN 3.4 (L) 03/23/2012   CALCIUM 9.1 11/29/2012   GFRAA 72 (L) 11/29/2012    Speciality Comments: No specialty comments available.  Procedures:  No procedures performed Allergies: Nsaids and Sulfa antibiotics   Assessment / Plan:     Visit Diagnoses: Fibromyalgia -patient has known history of fibromyalgia.  She has history of generalized pain, fatigue, arthralgias and myalgias.  She also had positive tender points.  She has noted improvement in her symptoms her gabapentin.  Detailed counseling guarding fibromyalgia was provided.  Need for regular exercise, stretching and good sleep hygiene was discussed.  I will refer her to physical therapy.- Plan: Ambulatory referral to Physical Therapy  Polyarthralgia -she complains of pain and discomfort in multiple joints.  She states she has had a total body bone scan in the past which was negative.  I do not see any synovitis on examination.  Per her request I will obtain following labs.  Plan: Rheumatoid factor, Cyclic citrul peptide antibody, IgG, ANA  Other fatigue -she complains of increased fatigue and insomnia.  Plan: CK, TSH  Primary insomnia-she gives history of insomnia for many years.  She states despite  sleeping with medication she feels tired in the morning.  Migraine without aura and without status migrainosus, not intractable-she is followed by neurologist.  She continues to have migraines.  She states the migraines have been better since she has been taking gabapentin.  Other medical problems are listed as follows:  Esophagitis  History of GI bleed  History of anemia  History of blood transfusion  Hypercholesterolemia  Orders: Orders Placed This Encounter  Procedures  . CK  . TSH  . Rheumatoid factor  . Cyclic citrul peptide antibody, IgG  . ANA  . Ambulatory referral to Physical Therapy   No orders of the defined types were placed in this encounter.    Follow-Up Instructions: Return if symptoms worsen or fail to improve, for Arthralgias, myalgias.  We will call her with the lab results.   Bo Merino, MD  Note - This record has been created using Editor, commissioning.  Chart creation errors have been sought, but may not always  have been located. Such creation errors do not reflect on  the standard of medical care.

## 2020-08-01 ENCOUNTER — Telehealth: Payer: Self-pay

## 2020-08-01 ENCOUNTER — Other Ambulatory Visit: Payer: Self-pay

## 2020-08-01 ENCOUNTER — Encounter: Payer: Self-pay | Admitting: Rheumatology

## 2020-08-01 ENCOUNTER — Ambulatory Visit: Payer: BC Managed Care – PPO | Admitting: Rheumatology

## 2020-08-01 VITALS — BP 128/80 | HR 86 | Resp 14 | Ht 66.0 in | Wt 149.0 lb

## 2020-08-01 DIAGNOSIS — M2550 Pain in unspecified joint: Secondary | ICD-10-CM

## 2020-08-01 DIAGNOSIS — F5101 Primary insomnia: Secondary | ICD-10-CM

## 2020-08-01 DIAGNOSIS — G43009 Migraine without aura, not intractable, without status migrainosus: Secondary | ICD-10-CM | POA: Diagnosis not present

## 2020-08-01 DIAGNOSIS — Z862 Personal history of diseases of the blood and blood-forming organs and certain disorders involving the immune mechanism: Secondary | ICD-10-CM

## 2020-08-01 DIAGNOSIS — Z8719 Personal history of other diseases of the digestive system: Secondary | ICD-10-CM

## 2020-08-01 DIAGNOSIS — M797 Fibromyalgia: Secondary | ICD-10-CM | POA: Diagnosis not present

## 2020-08-01 DIAGNOSIS — E7800 Pure hypercholesterolemia, unspecified: Secondary | ICD-10-CM

## 2020-08-01 DIAGNOSIS — Z9289 Personal history of other medical treatment: Secondary | ICD-10-CM

## 2020-08-01 DIAGNOSIS — R5383 Other fatigue: Secondary | ICD-10-CM

## 2020-08-01 DIAGNOSIS — K209 Esophagitis, unspecified without bleeding: Secondary | ICD-10-CM

## 2020-08-01 NOTE — Telephone Encounter (Signed)
Patient was seen as a new patient today and per Dr. Estanislado Pandy, if labs are normal, the follow up appointment can be canceled. Thanks!

## 2020-08-03 LAB — TSH: TSH: 2.95 mIU/L (ref 0.40–4.50)

## 2020-08-03 LAB — ANTI-NUCLEAR AB-TITER (ANA TITER): ANA Titer 1: 1:40 {titer} — ABNORMAL HIGH

## 2020-08-03 LAB — CYCLIC CITRUL PEPTIDE ANTIBODY, IGG: Cyclic Citrullin Peptide Ab: <16 UNITS

## 2020-08-03 LAB — RHEUMATOID FACTOR: Rheumatoid fact SerPl-aCnc: <14 IU/mL (ref <14)

## 2020-08-03 LAB — CK: Total CK: 140 U/L (ref 29–143)

## 2020-08-03 LAB — ANA: Anti Nuclear Antibody (ANA): POSITIVE — AB

## 2020-08-04 NOTE — Progress Notes (Signed)
Please notify patient that all autoimmune work-up is negative.  ANA is low titer which is not significant.  Thyroid functions and muscle enzyme tests are normal.  She was referred to physical therapy.  She will follow up with her PCP.

## 2020-08-20 ENCOUNTER — Telehealth: Payer: Self-pay | Admitting: Family Medicine

## 2020-08-20 MED ORDER — GABAPENTIN 300 MG PO CAPS: 300.0000 mg | ORAL_CAPSULE | Freq: Every day | ORAL | 0 refills | Status: DC

## 2020-08-20 NOTE — Addendum Note (Signed)
Addended by: Lester [place] A on: 08/20/2020 05:07 PM   Modules accepted: Orders

## 2020-08-20 NOTE — Telephone Encounter (Signed)
Gabapentin RX sent to [place].

## 2020-08-20 NOTE — Telephone Encounter (Signed)
Pt. is requesting gabapentin (NEURONTIN) 300 MG capsule be sent to [place].

## 2020-08-21 DIAGNOSIS — Z13820 Encounter for screening for osteoporosis: Secondary | ICD-10-CM | POA: Diagnosis not present

## 2020-08-21 DIAGNOSIS — Z1231 Encounter for screening mammogram for malignant neoplasm of breast: Secondary | ICD-10-CM | POA: Diagnosis not present

## 2020-08-21 DIAGNOSIS — Z01419 Encounter for gynecological examination (general) (routine) without abnormal findings: Secondary | ICD-10-CM | POA: Diagnosis not present

## 2020-08-21 DIAGNOSIS — Z6823 Body mass index (BMI) 23.0-23.9, adult: Secondary | ICD-10-CM | POA: Diagnosis not present

## 2020-09-03 DIAGNOSIS — M818 Other osteoporosis without current pathological fracture: Secondary | ICD-10-CM | POA: Diagnosis not present

## 2020-09-03 DIAGNOSIS — N898 Other specified noninflammatory disorders of vagina: Secondary | ICD-10-CM | POA: Diagnosis not present

## 2020-09-03 DIAGNOSIS — M810 Age-related osteoporosis without current pathological fracture: Secondary | ICD-10-CM | POA: Diagnosis not present

## 2020-09-03 DIAGNOSIS — N950 Postmenopausal bleeding: Secondary | ICD-10-CM | POA: Diagnosis not present

## 2020-10-08 DIAGNOSIS — M7051 Other bursitis of knee, right knee: Secondary | ICD-10-CM | POA: Diagnosis not present

## 2020-10-08 DIAGNOSIS — M7052 Other bursitis of knee, left knee: Secondary | ICD-10-CM | POA: Diagnosis not present

## 2020-10-10 DIAGNOSIS — M722 Plantar fascial fibromatosis: Secondary | ICD-10-CM | POA: Diagnosis not present

## 2020-10-16 DIAGNOSIS — M79672 Pain in left foot: Secondary | ICD-10-CM | POA: Diagnosis not present

## 2020-10-16 DIAGNOSIS — M25561 Pain in right knee: Secondary | ICD-10-CM | POA: Diagnosis not present

## 2020-10-16 DIAGNOSIS — M25562 Pain in left knee: Secondary | ICD-10-CM | POA: Diagnosis not present

## 2020-10-26 DIAGNOSIS — M25561 Pain in right knee: Secondary | ICD-10-CM | POA: Diagnosis not present

## 2020-10-26 DIAGNOSIS — M722 Plantar fascial fibromatosis: Secondary | ICD-10-CM | POA: Diagnosis not present

## 2020-11-09 DIAGNOSIS — M722 Plantar fascial fibromatosis: Secondary | ICD-10-CM | POA: Diagnosis not present

## 2020-11-09 DIAGNOSIS — M25562 Pain in left knee: Secondary | ICD-10-CM | POA: Diagnosis not present

## 2020-11-13 DIAGNOSIS — E7800 Pure hypercholesterolemia, unspecified: Secondary | ICD-10-CM | POA: Diagnosis not present

## 2020-11-13 DIAGNOSIS — R6889 Other general symptoms and signs: Secondary | ICD-10-CM | POA: Diagnosis not present

## 2020-11-13 DIAGNOSIS — Z0000 Encounter for general adult medical examination without abnormal findings: Secondary | ICD-10-CM | POA: Diagnosis not present

## 2020-11-15 ENCOUNTER — Ambulatory Visit: Payer: BC Managed Care – PPO | Admitting: Family Medicine

## 2020-11-27 ENCOUNTER — Ambulatory Visit: Payer: BC Managed Care – PPO | Admitting: Family Medicine

## 2020-11-27 ENCOUNTER — Encounter: Payer: Self-pay | Admitting: Family Medicine

## 2020-11-27 VITALS — BP 119/74 | HR 69 | Ht 66.4 in | Wt 146.4 lb

## 2020-11-27 DIAGNOSIS — G43009 Migraine without aura, not intractable, without status migrainosus: Secondary | ICD-10-CM | POA: Diagnosis not present

## 2020-11-27 MED ORDER — GABAPENTIN 300 MG PO CAPS: ORAL_CAPSULE | ORAL | 3 refills | Status: DC

## 2020-11-27 NOTE — Patient Instructions (Signed)
Below is our plan:  We will continue gabapentin 300mg  at bedtime. You may take an extra dose of gabapentin 300mg  during th day as needed for worsening headaches.   Please make sure you are staying well hydrated. I recommend 50-60 ounces daily. Well balanced diet and regular exercise encouraged. Consistent sleep schedule with 6-8 hours recommended.   Please continue follow up with care team as directed.   Follow up with me in 1 year   You may receive a survey regarding today's visit. I encourage you to leave honest feed back as I do use this information to improve patient care. Thank you for seeing me today!

## 2020-11-27 NOTE — Progress Notes (Signed)
Chief Complaint  Patient presents with   Follow-up    RM 2, alone. Here for migraine f/u, pt states migraines have been better, did have one episode of severe migraine for 1.5 week. Pt has noticed some vision change.      HISTORY OF PRESENT ILLNESS: 11/27/2020 ALL:  Cristina Sanchez returns for follow up for migraines. We restarted gabapentin at last follow up for migraine prevention. She feels that it may be helping some. She has noticed most benefit with decreased skin pain. She was seen by rheumatology and autoimmune work up negative. She was encouraged to continue healthy lifestyle habits. She denies severe headaches. She does have regular "mild" headaches that do not disrupt her day to day activities. Rizatriptan helps some. She feels that rest and supplemental treatment with CBD/delta 8 helps more than anything. She does describe feeling of broken glass in her eyes and occasional blurred vision. PCP advised ophthalmology follow up for dry eyes.   05/08/2020 ALL: Cristina Sanchez is a 60 y.o. female here today for follow up for migraines. She has continues rizatriptan and cyclobenzaprine for abortive therapy. She may be getting a few more headaches than usual. Has not tolerated nortriptyline. She was previously taking gabapentin for fibromyalgia management (can also help with migraine prevention) but reports that she weaned herself off of this medication. She reports that she hasn't taken it in about 3 years. She felt that it helped significantly with "skin pain". She is more tired than usual, especially after eating carbs. She eats a vegan diet. She [place] who was managing fibromyalgia pain. She is now followed by PCP. She uses CBD oil prn that may help a little. She is doing water aerobics about three times a week.    HISTORY (copied from previous note)  Cristina Sanchez is a 60 y.o. female here today for follow up for headaches. She took nortriptyline 10mg   for about a week and felt that constipation was much worse. She weaned medication. She tried taking it again around the first of the year and constipation returned. She feels that overall, she is doing ok. Headaches are fairly well managed. She has used CBD and feels that it helps more than anything else she has tried. She is feeling well today and without complaints.    HISTORY: (copied from Dr Rhea Belton note on 04/07/2019)   Cristina Sanchez has been followed up with our clinic for her chronic migraine with aura, also has history of fibromyalgia.   She continue have frequent dizziness spells,  also typical migraine headaches, about once or twice each week, she describes sudden onset transient vertigo sensation, with tinnitus, sometimes associated with her lateralized migraine headaches, light noise sensitivity, nauseous, can lasting for hours to days, sometimes her migraine is proceeded with visual auras,   Her sister, all 3 of her children has typical migraine headaches, sometimes with visual auras   She went through a lot of stress at the end of 2019, lost few family members   UPDATE Apr 07 2019: She continues to have frequent headaches, migraines twice a week, lateralized severe pounding headache with light noise sensitivity, Maxalt works well, takes away her headache in 30 to 60 minutes, she has been taking magnesium oxide and riboflavin, did not try Topamax as prescribed, worried about side effect   She complains a lot of stress,    REVIEW OF SYSTEMS: Out of a complete 14 system review of symptoms, the patient complains  only of the following symptoms, headaches, fatigue, pain and all other reviewed systems are negative.   ALLERGIES: Allergies  Allergen Reactions   Nsaids    Sulfa Antibiotics Hives    dizzness     HOME MEDICATIONS: Outpatient Medications Prior to Visit  Medication Sig Dispense Refill   Ascorbic Acid (VITAMIN C) 1000 MG tablet Take 1,000 mg by mouth daily.     b complex  vitamins capsule Take 1 capsule by mouth daily.     BIOTIN PO Take by mouth daily.     Boswellia Serrata (BOSWELLIA PO) Take by mouth.     Cholecalciferol (CVS VITAMIN D3 DROPS/INFANT PO) Take 4,000 Units by mouth. Twice weekly     Coenzyme Q10 (COQ-10 PO) Take by mouth.     Cyanocobalamin (VITAMIN B-12 PO) Take 2,000 mg by mouth.     cyclobenzaprine (FLEXERIL) 10 MG tablet Take 1 tablet (10 mg total) by mouth 2 (two) times daily. Muscle spasms 180 tablet 3   gabapentin (NEURONTIN) 300 MG capsule Take 1 capsule (300 mg total) by mouth at bedtime. 90 capsule 0   Ginger, Zingiber officinalis, (GINGER PO) Take by mouth.     Magnesium 100 MG CAPS Take 600 mg by mouth.     Melatonin 1 MG TABS Take 3 mg by mouth.      Menthol, Topical Analgesic, (BIOFREEZE EX) Apply topically. Biofreeze - up to four times daily.     Multiple Vitamins-Minerals (MULTIVITAMIN PO) Take by mouth daily.     NON FORMULARY CBD and delta 8 PRN     OVER THE COUNTER MEDICATION as needed. Delta 8     PENNSAID 2 % SOLN apply ONE pump TO THE affected AREA two TIMES daily AS NEEDED  1   Probiotic Product (PROBIOTIC DAILY PO) Take by mouth.     Riboflavin 400 MG TABS riboflavin (vitamin B2) 400 mg tablet  Take by oral route.     rizatriptan (MAXALT-MLT) 10 MG disintegrating tablet PLACE 1 TABLET ON TONGUE AT ONSET OF MIGRAINE, MAY REPEAT ONCE IN 2 HOURS IF NEEDED 36 tablet 7   Selenium 100 MCG TABS Take 100 mcg by mouth daily.     Turmeric (QC TUMERIC COMPLEX PO) Take by mouth.     Ginkgo Biloba (GINKOBA PO) Take by mouth.     No facility-administered medications prior to visit.     PAST MEDICAL HISTORY: Past Medical History:  Diagnosis Date   Arthritis R hip   Bursitis of left shoulder    hx of   Cancer ([place])    kidney cancer   Chest pain    Fibromyalgia    Gastric ulcer    hx of   GERD (gastroesophageal reflux disease)    Headache(784.0) migraines   Hearing loss    Right    Migraines    Rosacea    Tietze  syndrome    Vitamin D deficiency      PAST SURGICAL HISTORY: Past Surgical History:  Procedure Laterality Date   ABDOMINAL HYSTERECTOMY  2009   c section  1997   CYSTOCELE REPAIR  2007   D&c x2     DILATION AND CURETTAGE OF UTERUS  [id number]   x2   Diliation and Coutar     ESOPHAGOGASTRODUODENOSCOPY  03/24/2012   Procedure: ESOPHAGOGASTRODUODENOSCOPY (EGD);  Surgeon: Milus Banister, MD;  Location: [facility];  Service: Endoscopy;  Laterality: N/A;   RECTOCELE REPAIR  2007   ROBOT ASSISTED LAPAROSCOPIC NEPHRECTOMY Left 11/26/2012  Procedure: ROBOTIC ASSISTED LAPAROSCOPIC NEPHRECTOMY;  Surgeon: Alexis Frock, MD;  Location: [facility];  Service: Urology;  Laterality: Left;     FAMILY HISTORY: Family History  Problem Relation Age of Onset   Dementia Mother    Heart attack Father    Kidney cancer Brother    Cervical cancer Sister      SOCIAL HISTORY: Social History   Socioeconomic History   Marital status: Married    Spouse name: Cristina Sanchez   Number of children: 3   Years of education: college   Highest education level: Not on file  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    Patient has 3 children.    Patient is currently a homemaker.    Patient is right handed.          Social Determinants of Health   Financial Resource Strain: Not on file  Food Insecurity: Not on file  Transportation Needs: Not on file  Physical Activity: Not on file  Stress: Not on file  Social Connections: Not on file  Intimate Partner Violence: Not on file      PHYSICAL EXAM  Vitals:   11/27/20 1334  BP: 119/74  Pulse: 69  Weight: 146 lb  6.4 oz (66.4 kg)  Height: 5' 6.4" (1.687 m)    Body mass index is 23.35 kg/m.   Generalized: Well developed, in no acute distress  Cardiology: normal rate and rhythm, no murmur auscultated  Respiratory: clear to auscultation bilaterally    Neurological examination  Mentation: Alert oriented to time, place, history taking. Follows all commands speech and language fluent Cranial nerve II-XII: Pupils were equal round reactive to light. Extraocular movements were full, visual field were full on confrontational test. Facial sensation and strength were normal. Head turning and shoulder shrug were normal and symmetric. Motor: The motor testing reveals 5 over 5 strength of all 4 extremities. Good symmetric motor tone is noted throughout.  Gait and station: Gait is normal.     DIAGNOSTIC DATA (LABS, IMAGING, TESTING) - I reviewed patient records, labs, notes, testing and imaging myself where available.  Lab Results  Component Value Date   WBC 5.0 11/22/2012   HGB 10.5 (L) 11/27/2012   HCT 30.7 (L) 11/27/2012   MCV 91.6 11/22/2012   PLT 330 11/22/2012      Component Value Date/Time   NA 133 (L) 11/29/2012 0430   K 4.3 11/29/2012 0430   CL 99 11/29/2012 0430   CO2 30 11/29/2012 0430   GLUCOSE 109 (H) 11/29/2012 0430   BUN 8 11/29/2012 0430   CREATININE 0.80 08/03/2019 0933   CALCIUM 9.1 11/29/2012 0430   PROT 6.2 03/23/2012 2010   ALBUMIN 3.4 (L) 03/23/2012 2010   AST 14 03/23/2012 2010   ALT 12 03/23/2012 2010   ALKPHOS 44 03/23/2012 2010   BILITOT 0.3 03/23/2012 2010   GFRNONAA 62 (L) 11/29/2012 0430   GFRAA 72 (L) 11/29/2012 0430   No results found for: CHOL, HDL, LDLCALC, LDLDIRECT, TRIG, CHOLHDL No results found for: HGBA1C No results found  for: VITAMINB12 Lab Results  Component Value Date   TSH 2.95 08/01/2020      ASSESSMENT AND PLAN  60 y.o. year old female  has a past medical history of Arthritis (R hip), Bursitis of left shoulder, Cancer ([place]), Chest  pain, Fibromyalgia, Gastric ulcer, GERD (gastroesophageal reflux disease), Headache(784.0) (migraines), Hearing loss, Migraines, Rosacea, Tietze syndrome, and Vitamin D deficiency. here with   Migraine without aura and without status migrainosus, not intractable  Reyah feels that gabapentin may help somewhat with headache severity and has helped with decreasing skin pain. She is tolerating it well. We will continue 300mg  every night at bedtime. She may also take 300mg  in the mornings as needed. Healthy lifestyle habits advised. She plans to schedule follow up with ophthalmology for dry eyes.  I have encouraged her to follow-up closely with her primary care provider for management of fibromyalgia should skin pain or fatigue worsen.  Healthy lifestyle habits encouraged.  She will follow-up in 1 year, sooner if needed.  She verbalizes understanding and agreement with this plan.    Debbora Presto, MSN, FNP-C 11/27/2020, 1:43 PM  [facility] [address], [place], [place] [address] [phone]

## 2020-12-31 DIAGNOSIS — R0981 Nasal congestion: Secondary | ICD-10-CM | POA: Diagnosis not present

## 2020-12-31 DIAGNOSIS — R5383 Other fatigue: Secondary | ICD-10-CM | POA: Diagnosis not present

## 2020-12-31 DIAGNOSIS — R051 Acute cough: Secondary | ICD-10-CM | POA: Diagnosis not present

## 2021-01-01 DIAGNOSIS — R059 Cough, unspecified: Secondary | ICD-10-CM | POA: Diagnosis not present

## 2021-01-01 DIAGNOSIS — Z03818 Encounter for observation for suspected exposure to other biological agents ruled out: Secondary | ICD-10-CM | POA: Diagnosis not present

## 2021-02-15 DIAGNOSIS — R21 Rash and other nonspecific skin eruption: Secondary | ICD-10-CM | POA: Diagnosis not present

## 2021-03-27 DIAGNOSIS — R319 Hematuria, unspecified: Secondary | ICD-10-CM | POA: Diagnosis not present

## 2021-03-27 DIAGNOSIS — R102 Pelvic and perineal pain: Secondary | ICD-10-CM | POA: Diagnosis not present

## 2021-03-27 DIAGNOSIS — R109 Unspecified abdominal pain: Secondary | ICD-10-CM | POA: Diagnosis not present

## 2021-04-11 DIAGNOSIS — R102 Pelvic and perineal pain: Secondary | ICD-10-CM | POA: Diagnosis not present

## 2021-05-22 ENCOUNTER — Other Ambulatory Visit: Payer: Self-pay | Admitting: *Deleted

## 2021-05-22 MED ORDER — RIZATRIPTAN BENZOATE 10 MG PO TBDP: ORAL_TABLET | ORAL | 3 refills | Status: DC

## 2021-07-30 DIAGNOSIS — M7051 Other bursitis of knee, right knee: Secondary | ICD-10-CM | POA: Diagnosis not present

## 2021-07-30 DIAGNOSIS — M7061 Trochanteric bursitis, right hip: Secondary | ICD-10-CM | POA: Diagnosis not present

## 2021-07-30 DIAGNOSIS — M7062 Trochanteric bursitis, left hip: Secondary | ICD-10-CM | POA: Diagnosis not present

## 2021-07-30 DIAGNOSIS — M25562 Pain in left knee: Secondary | ICD-10-CM | POA: Diagnosis not present

## 2021-07-30 DIAGNOSIS — M7052 Other bursitis of knee, left knee: Secondary | ICD-10-CM | POA: Diagnosis not present

## 2021-07-30 DIAGNOSIS — M25561 Pain in right knee: Secondary | ICD-10-CM | POA: Diagnosis not present

## 2021-08-13 DIAGNOSIS — M6281 Muscle weakness (generalized): Secondary | ICD-10-CM | POA: Diagnosis not present

## 2021-08-13 DIAGNOSIS — M62838 Other muscle spasm: Secondary | ICD-10-CM | POA: Diagnosis not present

## 2021-08-13 DIAGNOSIS — K5900 Constipation, unspecified: Secondary | ICD-10-CM | POA: Diagnosis not present

## 2021-08-13 DIAGNOSIS — N393 Stress incontinence (female) (male): Secondary | ICD-10-CM | POA: Diagnosis not present

## 2021-08-20 DIAGNOSIS — M62838 Other muscle spasm: Secondary | ICD-10-CM | POA: Diagnosis not present

## 2021-08-20 DIAGNOSIS — N393 Stress incontinence (female) (male): Secondary | ICD-10-CM | POA: Diagnosis not present

## 2021-08-20 DIAGNOSIS — M6281 Muscle weakness (generalized): Secondary | ICD-10-CM | POA: Diagnosis not present

## 2021-08-27 DIAGNOSIS — M62838 Other muscle spasm: Secondary | ICD-10-CM | POA: Diagnosis not present

## 2021-08-27 DIAGNOSIS — N393 Stress incontinence (female) (male): Secondary | ICD-10-CM | POA: Diagnosis not present

## 2021-08-27 DIAGNOSIS — M6281 Muscle weakness (generalized): Secondary | ICD-10-CM | POA: Diagnosis not present

## 2021-08-27 DIAGNOSIS — K5900 Constipation, unspecified: Secondary | ICD-10-CM | POA: Diagnosis not present

## 2021-08-30 DIAGNOSIS — M25552 Pain in left hip: Secondary | ICD-10-CM | POA: Diagnosis not present

## 2021-08-30 DIAGNOSIS — M25561 Pain in right knee: Secondary | ICD-10-CM | POA: Diagnosis not present

## 2021-08-30 DIAGNOSIS — M25551 Pain in right hip: Secondary | ICD-10-CM | POA: Diagnosis not present

## 2021-08-30 DIAGNOSIS — M25562 Pain in left knee: Secondary | ICD-10-CM | POA: Diagnosis not present

## 2021-09-03 DIAGNOSIS — M25551 Pain in right hip: Secondary | ICD-10-CM | POA: Diagnosis not present

## 2021-09-03 DIAGNOSIS — M25552 Pain in left hip: Secondary | ICD-10-CM | POA: Diagnosis not present

## 2021-09-03 DIAGNOSIS — M62838 Other muscle spasm: Secondary | ICD-10-CM | POA: Diagnosis not present

## 2021-09-03 DIAGNOSIS — M6281 Muscle weakness (generalized): Secondary | ICD-10-CM | POA: Diagnosis not present

## 2021-09-03 DIAGNOSIS — K5900 Constipation, unspecified: Secondary | ICD-10-CM | POA: Diagnosis not present

## 2021-09-03 DIAGNOSIS — M25561 Pain in right knee: Secondary | ICD-10-CM | POA: Diagnosis not present

## 2021-09-03 DIAGNOSIS — N3941 Urge incontinence: Secondary | ICD-10-CM | POA: Diagnosis not present

## 2021-09-03 DIAGNOSIS — M25562 Pain in left knee: Secondary | ICD-10-CM | POA: Diagnosis not present

## 2021-09-10 DIAGNOSIS — N393 Stress incontinence (female) (male): Secondary | ICD-10-CM | POA: Diagnosis not present

## 2021-09-10 DIAGNOSIS — M6281 Muscle weakness (generalized): Secondary | ICD-10-CM | POA: Diagnosis not present

## 2021-09-10 DIAGNOSIS — M25561 Pain in right knee: Secondary | ICD-10-CM | POA: Diagnosis not present

## 2021-09-10 DIAGNOSIS — M25551 Pain in right hip: Secondary | ICD-10-CM | POA: Diagnosis not present

## 2021-09-10 DIAGNOSIS — M25552 Pain in left hip: Secondary | ICD-10-CM | POA: Diagnosis not present

## 2021-09-10 DIAGNOSIS — M25562 Pain in left knee: Secondary | ICD-10-CM | POA: Diagnosis not present

## 2021-09-10 DIAGNOSIS — K5900 Constipation, unspecified: Secondary | ICD-10-CM | POA: Diagnosis not present

## 2021-09-10 DIAGNOSIS — M62838 Other muscle spasm: Secondary | ICD-10-CM | POA: Diagnosis not present

## 2021-09-16 DIAGNOSIS — M25551 Pain in right hip: Secondary | ICD-10-CM | POA: Diagnosis not present

## 2021-09-16 DIAGNOSIS — M25552 Pain in left hip: Secondary | ICD-10-CM | POA: Diagnosis not present

## 2021-09-16 DIAGNOSIS — M25561 Pain in right knee: Secondary | ICD-10-CM | POA: Diagnosis not present

## 2021-09-16 DIAGNOSIS — M25562 Pain in left knee: Secondary | ICD-10-CM | POA: Diagnosis not present

## 2021-09-17 DIAGNOSIS — M62838 Other muscle spasm: Secondary | ICD-10-CM | POA: Diagnosis not present

## 2021-09-17 DIAGNOSIS — M6281 Muscle weakness (generalized): Secondary | ICD-10-CM | POA: Diagnosis not present

## 2021-09-17 DIAGNOSIS — N393 Stress incontinence (female) (male): Secondary | ICD-10-CM | POA: Diagnosis not present

## 2021-09-17 DIAGNOSIS — K5900 Constipation, unspecified: Secondary | ICD-10-CM | POA: Diagnosis not present

## 2021-09-23 DIAGNOSIS — Z23 Encounter for immunization: Secondary | ICD-10-CM | POA: Diagnosis not present

## 2021-09-27 DIAGNOSIS — M25552 Pain in left hip: Secondary | ICD-10-CM | POA: Diagnosis not present

## 2021-09-27 DIAGNOSIS — M25562 Pain in left knee: Secondary | ICD-10-CM | POA: Diagnosis not present

## 2021-09-27 DIAGNOSIS — M25561 Pain in right knee: Secondary | ICD-10-CM | POA: Diagnosis not present

## 2021-09-27 DIAGNOSIS — M25551 Pain in right hip: Secondary | ICD-10-CM | POA: Diagnosis not present

## 2021-10-01 DIAGNOSIS — M6281 Muscle weakness (generalized): Secondary | ICD-10-CM | POA: Diagnosis not present

## 2021-10-01 DIAGNOSIS — N393 Stress incontinence (female) (male): Secondary | ICD-10-CM | POA: Diagnosis not present

## 2021-10-01 DIAGNOSIS — M62838 Other muscle spasm: Secondary | ICD-10-CM | POA: Diagnosis not present

## 2021-10-01 DIAGNOSIS — K5900 Constipation, unspecified: Secondary | ICD-10-CM | POA: Diagnosis not present

## 2021-10-08 DIAGNOSIS — K5900 Constipation, unspecified: Secondary | ICD-10-CM | POA: Diagnosis not present

## 2021-10-08 DIAGNOSIS — M6281 Muscle weakness (generalized): Secondary | ICD-10-CM | POA: Diagnosis not present

## 2021-10-08 DIAGNOSIS — M62838 Other muscle spasm: Secondary | ICD-10-CM | POA: Diagnosis not present

## 2021-10-08 DIAGNOSIS — N393 Stress incontinence (female) (male): Secondary | ICD-10-CM | POA: Diagnosis not present

## 2021-10-16 DIAGNOSIS — Z01419 Encounter for gynecological examination (general) (routine) without abnormal findings: Secondary | ICD-10-CM | POA: Diagnosis not present

## 2021-10-16 DIAGNOSIS — Z6821 Body mass index (BMI) 21.0-21.9, adult: Secondary | ICD-10-CM | POA: Diagnosis not present

## 2021-10-16 DIAGNOSIS — Z1231 Encounter for screening mammogram for malignant neoplasm of breast: Secondary | ICD-10-CM | POA: Diagnosis not present

## 2021-10-22 ENCOUNTER — Telehealth: Payer: Self-pay | Admitting: Family Medicine

## 2021-10-22 NOTE — Telephone Encounter (Signed)
LVM and sent mychart msg informing pt of r/s needed for 6/21 appt- Amy out. ?

## 2021-11-27 ENCOUNTER — Ambulatory Visit: Payer: BC Managed Care – PPO | Admitting: Family Medicine

## 2021-12-02 DIAGNOSIS — H2513 Age-related nuclear cataract, bilateral: Secondary | ICD-10-CM | POA: Diagnosis not present

## 2021-12-04 DIAGNOSIS — M25551 Pain in right hip: Secondary | ICD-10-CM | POA: Diagnosis not present

## 2021-12-04 DIAGNOSIS — Z0000 Encounter for general adult medical examination without abnormal findings: Secondary | ICD-10-CM | POA: Diagnosis not present

## 2021-12-04 DIAGNOSIS — M25561 Pain in right knee: Secondary | ICD-10-CM | POA: Diagnosis not present

## 2021-12-04 DIAGNOSIS — M25552 Pain in left hip: Secondary | ICD-10-CM | POA: Diagnosis not present

## 2021-12-04 DIAGNOSIS — R634 Abnormal weight loss: Secondary | ICD-10-CM | POA: Diagnosis not present

## 2021-12-04 DIAGNOSIS — M25562 Pain in left knee: Secondary | ICD-10-CM | POA: Diagnosis not present

## 2021-12-04 DIAGNOSIS — E7800 Pure hypercholesterolemia, unspecified: Secondary | ICD-10-CM | POA: Diagnosis not present

## 2021-12-04 DIAGNOSIS — M797 Fibromyalgia: Secondary | ICD-10-CM | POA: Diagnosis not present

## 2021-12-04 DIAGNOSIS — E559 Vitamin D deficiency, unspecified: Secondary | ICD-10-CM | POA: Diagnosis not present

## 2021-12-12 ENCOUNTER — Other Ambulatory Visit: Payer: Self-pay | Admitting: Family Medicine

## 2021-12-12 ENCOUNTER — Ambulatory Visit
Admission: RE | Admit: 2021-12-12 | Discharge: 2021-12-12 | Disposition: A | Discharge status: Home / Self Care | Payer: BC Managed Care – PPO | Source: Ambulatory Visit | Attending: Family Medicine | Admitting: Family Medicine

## 2021-12-12 DIAGNOSIS — M25551 Pain in right hip: Secondary | ICD-10-CM

## 2021-12-12 DIAGNOSIS — M25561 Pain in right knee: Secondary | ICD-10-CM

## 2021-12-12 DIAGNOSIS — M1711 Unilateral primary osteoarthritis, right knee: Secondary | ICD-10-CM | POA: Diagnosis not present

## 2021-12-12 DIAGNOSIS — M160 Bilateral primary osteoarthritis of hip: Secondary | ICD-10-CM | POA: Diagnosis not present

## 2021-12-12 DIAGNOSIS — M1712 Unilateral primary osteoarthritis, left knee: Secondary | ICD-10-CM | POA: Diagnosis not present

## 2021-12-23 DIAGNOSIS — Z23 Encounter for immunization: Secondary | ICD-10-CM | POA: Diagnosis not present

## 2022-06-03 ENCOUNTER — Ambulatory Visit: Payer: BC Managed Care – PPO | Admitting: Neurology

## 2022-07-22 ENCOUNTER — Telehealth: Payer: Self-pay | Admitting: Gastroenterology

## 2022-07-22 NOTE — Telephone Encounter (Signed)
Called patient to ask if she has a provider and the reason we received records. Left voicemail.

## 2022-07-24 NOTE — Telephone Encounter (Signed)
Patient returned call, stated she would be proceeding care with [facility].

## 2022-07-31 ENCOUNTER — Ambulatory Visit: Payer: BC Managed Care – PPO | Admitting: Neurology

## 2022-07-31 ENCOUNTER — Encounter: Payer: Self-pay | Admitting: Neurology

## 2022-07-31 VITALS — BP 119/79 | HR 82 | Ht 66.0 in | Wt 137.0 lb

## 2022-07-31 DIAGNOSIS — R413 Other amnesia: Secondary | ICD-10-CM | POA: Diagnosis not present

## 2022-07-31 DIAGNOSIS — R42 Dizziness and giddiness: Secondary | ICD-10-CM

## 2022-07-31 DIAGNOSIS — G43109 Migraine with aura, not intractable, without status migrainosus: Secondary | ICD-10-CM | POA: Diagnosis not present

## 2022-07-31 MED ORDER — VENLAFAXINE HCL ER 37.5 MG PO CP24: 37.5000 mg | ORAL_CAPSULE | Freq: Every day | ORAL | 11 refills | Status: DC

## 2022-07-31 MED ORDER — ONDANSETRON HCL 4 MG PO TABS: 4.0000 mg | ORAL_TABLET | Freq: Three times a day (TID) | ORAL | 6 refills | Status: DC | PRN

## 2022-07-31 MED ORDER — TIZANIDINE HCL 4 MG PO TABS: 4.0000 mg | ORAL_TABLET | Freq: Four times a day (QID) | ORAL | 6 refills | Status: DC | PRN

## 2022-07-31 MED ORDER — ZOLMITRIPTAN 5 MG PO TBDP: 5.0000 mg | ORAL_TABLET | ORAL | 11 refills | Status: DC | PRN

## 2022-07-31 NOTE — Progress Notes (Signed)
Chief Complaint  Patient presents with   Follow-up    RM 14 alone Pt is well, migraines are stable but she was having a increase in dizziness the last yr.      ASSESSMENT AND PLAN  Cristina Sanchez is a 62 y.o. female   Chronic migraine headaches Worsening anxiety Short-term memory loss  Dizziness  Most related to her worsening mood disorder,  She desires further evaluations will proceed with MRI of the brain to rule out structural abnormality  Effexor XR 37.5 mg daily as migraine prevention  Suboptimal response to Maxalt, before dissolvable, will try Zomig 5 mg dissolvable as needed, may combine with Aleve, tizanidine, Zofran for prolonged severe headaches,    Return To Clinic With NP In 6 Months   DIAGNOSTIC DATA (LABS, IMAGING, TESTING) - I reviewed patient records, labs, notes, testing and imaging myself where available.   MEDICAL HISTORY:  Cristina Sanchez, is here for follow-up constellation of complaints, chronic migraine, her primary care physician is Dr.   Radene Sanchez, [place]    I reviewed and summarized the referring note. PMHX. Anxiety Chronic migraine Fibromyalgia  [name] has been followed by our clinic for many years for chronic migraine headaches, over the years, she was noted to have significant anxiety, also complains of fibromyalgia  She is tearful at today's clinical visit, complains of very stressful few years since 2019, lost multiple family members,    With all the stress, she complains of increased anxiety, frequent dizziness, almost daily basis, described as Ajovy headache, sometimes difficulty focusing, mild degree of daily headaches couple times each week it would exacerbated to almost severe pounding headache, require Maxalt, helps sometimes, but also she has to go to bed, suffer more prolonged even days of headache  Mother suffered vascular dementia died at age 14, she noticed word finding difficulties, mild short-term memory loss, she did not  finish her college, 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  PHYSICAL EXAMNIATION:  Gen: NAD, conversant, well nourised, well groomed                     Cardiovascular: Regular rate rhythm, no peripheral edema, warm, nontender. Eyes: Conjunctivae clear without exudates or hemorrhage Neck: Supple, no carotid bruits. Pulmonary: Clear to auscultation bilaterally   NEUROLOGICAL EXAM:  MENTAL STATUS: Speech/cognition: Anxious tearful at today's clinical examination, oriented to history taking and casual conversation    07/31/2022   11:00 AM  Montreal Cognitive Assessment   Visuospatial/ Executive (0/5) 5  Naming (0/3) 3  Attention: Read list of digits (0/2) 1  Attention: Read list of letters (0/1) 1  Attention: Serial 7 subtraction starting at 100 (0/3) 3  Language: Repeat phrase (0/2) 2  Language : Fluency (0/1) 0  Abstraction (0/2) 2  Delayed Recall (0/5) 5  Orientation (0/6) 6  Total 28    CRANIAL NERVES: CN II: Visual fields are full to confrontation. Pupils are round equal and briskly reactive to light. CN III, IV, VI: extraocular movement are normal. No ptosis. CN V: Facial sensation is intact to light touch CN VII: Face is symmetric with normal eye closure  CN VIII: Hearing is normal to causal conversation. CN IX, X: Phonation is  normal. CN XI: Head turning and shoulder shrug are intact  MOTOR: There is no pronator drift of out-stretched arms. Muscle bulk and tone are normal. Muscle strength is normal.  REFLEXES: Reflexes are 2+ and symmetric at the biceps, triceps, knees, and ankles. Plantar responses are flexor.  SENSORY: Intact to light touch, pinprick and vibratory sensation are intact in fingers and toes.  COORDINATION: There is no trunk or limb dysmetria  noted.  GAIT/STANCE: Posture is normal. Gait is steady   REVIEW OF SYSTEMS:  Full 14 system review of systems performed and notable only for as above All other review of systems were negative.   ALLERGIES: Allergies  Allergen Reactions   Nsaids    Sulfa Antibiotics Hives    dizzness    HOME MEDICATIONS: Current Outpatient Medications  Medication Sig Dispense Refill   Ascorbic Acid (VITAMIN C) 1000 MG tablet Take 1,000 mg by mouth daily.     b complex vitamins capsule Take 1 capsule by mouth daily.     BIOTIN PO Take by mouth daily.     Boswellia Serrata (BOSWELLIA PO) Take by mouth.     Cholecalciferol (CVS VITAMIN D3 DROPS/INFANT PO) Take 4,000 Units by mouth. Twice weekly     Coenzyme Q10 (COQ-10 PO) Take by mouth.     Cyanocobalamin (VITAMIN B-12 PO) Take 2,000 mg by mouth.     cyclobenzaprine (FLEXERIL) 10 MG tablet Take 1 tablet (10 mg total) by mouth 2 (two) times daily. Muscle spasms 180 tablet 3   gabapentin (NEURONTIN) 300 MG capsule Take 347m daily at bedtime, may take 3063mas needed in the mornings for headaches 180 capsule 3   Ginger, Zingiber officinalis, (GINGER PO) Take by mouth.     Magnesium 100 MG CAPS Take 600 mg by mouth.     Melatonin 1 MG TABS Take 3 mg by mouth.      Menthol, Topical Analgesic, (BIOFREEZE EX) Apply topically. Biofreeze - up to four times daily.     Multiple Vitamins-Minerals (MULTIVITAMIN PO) Take by mouth daily.     NON FORMULARY CBD and delta 8 PRN     OVER THE COUNTER MEDICATION as needed. Delta 8     PENNSAID 2 % SOLN apply ONE pump TO THE affected AREA two TIMES daily AS NEEDED  1   Probiotic Product (PROBIOTIC DAILY PO) Take by mouth.     Riboflavin 400 MG TABS riboflavin (vitamin B2) 400 mg tablet  Take by oral route.     rizatriptan (MAXALT-MLT) 10 MG disintegrating tablet PLACE 1 TABLET ON TONGUE AT ONSET OF MIGRAINE, MAY REPEAT ONCE IN 2 HOURS IF NEEDED 36 tablet 3   Turmeric (QC TUMERIC COMPLEX PO) Take by mouth.     No  current facility-administered medications for this visit.    PAST MEDICAL HISTORY: Past Medical History:  Diagnosis Date   Arthritis R hip   Bursitis of left shoulder    hx of   [place]   kid[place] cancer   Chest pain    Fibromyalgia    Gastric ulcer    hx of   GERD (gastroesophageal reflux disease)    Headache(784.0) migraines   Hearing loss    Right    Migraines    Rosacea    Tietze syndrome    Vitamin D deficiency     PAST SURGICAL HISTORY: Past Surgical History:  Procedure Laterality Date   ABDOMINAL HYSTERECTOMY  2009   c section  [address]  2007   D&c x2     DILATION AND CURETTAGE OF UTERUS  [id number]   x2   Diliation and Coutar     ESOPHAGOGASTRODUODENOSCOPY  03/24/2012   Procedure: ESOPHAGOGASTRODUODENOSCOPY (EGD);  Surgeon: Milus Banister, MD;  Location: [facility];  Service: Endoscopy;  Laterality: N/A;   RECTOCELE REPAIR  2007   ROBOT ASSISTED LAPAROSCOPIC NEPHRECTOMY Left 11/26/2012   Procedure: ROBOTIC ASSISTED LAPAROSCOPIC NEPHRECTOMY;  Surgeon: Alexis Frock, MD;  Location: [facility];  Service: Urology;  Laterality: Left;    FAMILY HISTORY: Family History  Problem Relation Age of Onset   Dementia Mother    Heart attack Father    Kidney cancer Brother    Cervical cancer Sister     SOCIAL HISTORY: Social History   Socioeconomic History   Marital status: Married    Spouse name: martin   Number of children: 3   Years of education: college   Highest education level: Not on file  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    Patient has 3 children.    Patient is currently a homemaker.     Patient is right handed.          Social Determinants of Health   Financial Resource Strain: Not on file  Food Insecurity: Not on file  Transportation Needs: Not on file  Physical Activity: Not on file  Stress: Not on file  Social Connections: Not on file  Intimate Partner Violence: Not on file      Marcial Pacas, M.D. Ph.D.  [facility] [address] [place], [place] [address] Ph: [phone] Fax: [phone]  CC:  Aretta Nip, [place],  [place] [address]  Cristina Sanchez, Bill Salinas, MD

## 2022-07-31 NOTE — Patient Instructions (Addendum)
Meds ordered this encounter  Medications   venlafaxine XR (EFFEXOR XR) 37.5 MG 24 hr capsule    Sig: Take 1 capsule (37.5 mg total) by mouth daily with breakfast.    Dispense:  30 capsule    Refill:  11   zolmitriptan (ZOMIG ZMT) 5 MG disintegrating tablet    Sig: Take 1 tablet (5 mg total) by mouth as needed for migraine.    Dispense:  10 tablet    Refill:  11   ondansetron (ZOFRAN) 4 MG tablet    Sig: Take 1 tablet (4 mg total) by mouth every 8 (eight) hours as needed for nausea or vomiting.    Dispense:  20 tablet    Refill:  6   tiZANidine (ZANAFLEX) 4 MG tablet    Sig: Take 1 tablet (4 mg total) by mouth every 6 (six) hours as needed.    Dispense:  30 tablet    Refill:  6     Aleve as needed.

## 2022-08-11 ENCOUNTER — Telehealth: Payer: Self-pay | Admitting: Neurology

## 2022-08-11 NOTE — Telephone Encounter (Signed)
I returned the patient's call and have to leave a voice mail. I gave her my direct line to call back.

## 2022-08-11 NOTE — Telephone Encounter (Signed)
Pt called stated she is calling to schedule MRI. Pt is requesting a call back.

## 2022-08-13 ENCOUNTER — Ambulatory Visit: Payer: BC Managed Care – PPO

## 2022-08-13 DIAGNOSIS — G43109 Migraine with aura, not intractable, without status migrainosus: Secondary | ICD-10-CM | POA: Diagnosis not present

## 2022-08-13 DIAGNOSIS — R42 Dizziness and giddiness: Secondary | ICD-10-CM

## 2022-08-22 ENCOUNTER — Other Ambulatory Visit: Payer: Self-pay | Admitting: Neurology

## 2022-10-20 DIAGNOSIS — Z1231 Encounter for screening mammogram for malignant neoplasm of breast: Secondary | ICD-10-CM | POA: Diagnosis not present

## 2022-10-20 DIAGNOSIS — Z01419 Encounter for gynecological examination (general) (routine) without abnormal findings: Secondary | ICD-10-CM | POA: Diagnosis not present

## 2022-10-20 DIAGNOSIS — Z13820 Encounter for screening for osteoporosis: Secondary | ICD-10-CM | POA: Diagnosis not present

## 2022-10-20 DIAGNOSIS — Z6822 Body mass index (BMI) 22.0-22.9, adult: Secondary | ICD-10-CM | POA: Diagnosis not present

## 2022-11-12 DIAGNOSIS — M810 Age-related osteoporosis without current pathological fracture: Secondary | ICD-10-CM | POA: Diagnosis not present

## 2022-11-26 DIAGNOSIS — M818 Other osteoporosis without current pathological fracture: Secondary | ICD-10-CM | POA: Diagnosis not present

## 2023-01-14 DIAGNOSIS — K5900 Constipation, unspecified: Secondary | ICD-10-CM | POA: Diagnosis not present

## 2023-01-15 DIAGNOSIS — Z0000 Encounter for general adult medical examination without abnormal findings: Secondary | ICD-10-CM | POA: Diagnosis not present

## 2023-01-15 DIAGNOSIS — Z23 Encounter for immunization: Secondary | ICD-10-CM | POA: Diagnosis not present

## 2023-01-15 DIAGNOSIS — E7800 Pure hypercholesterolemia, unspecified: Secondary | ICD-10-CM | POA: Diagnosis not present

## 2023-01-15 DIAGNOSIS — E559 Vitamin D deficiency, unspecified: Secondary | ICD-10-CM | POA: Diagnosis not present

## 2023-01-29 NOTE — Progress Notes (Signed)
Chief Complaint  Patient presents with   Follow-up    Pt in room 2. Here for migraine follow up. Pt said migraines are better, last migraine was early last week. Pt said last migraine was not bad, had dizziness.      HISTORY OF PRESENT ILLNESS:  02/02/2023 ALL: Cristina Sanchez returns for follow up for memory loss and headaches. She was last seen by Dr Terrace Arabia 07/2022. MoCA 28/30. MRI was unremarkable. She was started on venlafaxine 37.5 and Zomig 5mg  PRN. Since, she reports headaches have been fairly stable. She did not start venlafaxine. She did try Zomig but was concerned that it may have caused some chest pain. She feels headaches have been better over the past few months. She continues to focus on healthy diet and takes supplements. She is followed by PCP, regularly. Dr Barbaraann Barthel retired. She continues to have times where she worries that she can't remember things as easily as she could in the past. Occasional dizziness but not as severe. She has been participating in Hinge therapy (online PT) since 08/2022 and feels it is helping.    07/31/2022 YY: ELASIA VANDEWALKER, is here for follow-up constellation of complaints, chronic migraine, her primary care physician is Dr.   Barbaraann Barthel, [place]    I reviewed and summarized the referring note. PMHX. Anxiety Chronic migraine Fibromyalgia   Leina has been followed by our clinic for many years for chronic migraine headaches, over the years, she was noted to have significant anxiety, also complains of fibromyalgia   She is tearful at today's clinical visit, complains of very stressful few years since 2019, lost multiple family members,     With all the stress, she complains of increased anxiety, frequent dizziness, almost daily basis, described as Ajovy headache, sometimes difficulty focusing, mild degree of daily headaches couple times each week it would exacerbated to almost severe pounding headache, require Maxalt, helps sometimes, but also she has to go to bed,  suffer more prolonged even days of headache   Mother suffered vascular dementia died at age 70, she noticed word finding difficulties, mild short-term memory loss, she did not finish her college, 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 We restarted gabapentin at last follow up for migraine prevention. She feels that it may be helping some. She has noticed most benefit with decreased skin pain. She was seen by rheumatology and autoimmune work up negative. She was encouraged to continue healthy lifestyle habits. She denies severe headaches. She does have regular "mild" headaches that do not disrupt her day to day activities. Rizatriptan helps some. She feels that rest and supplemental treatment with CBD/delta 8 helps more than anything. She does describe feeling of broken glass in her eyes and occasional blurred vision. PCP advised ophthalmology follow up for dry eyes.   05/08/2020 ALL: DAYLYNN BERGSTRAND is a 62 y.o. female here today for follow up for migraines. She has continues rizatriptan and cyclobenzaprine for abortive therapy. She may be getting a few more headaches than usual. Has not tolerated nortriptyline. She was previously taking gabapentin for fibromyalgia management (can also help with migraine prevention) but reports that she weaned herself off of this medication. She reports that she hasn't taken it in about 3 years. She felt that it helped significantly with "skin pain". She is more tired than usual, especially after eating carbs. She eats a vegan diet. She was previously [place]  [place] neurologist [place] who was managing fibromyalgia pain. She is now followed by PCP. She uses CBD oil prn that may help a little. She is doing water aerobics about three times a week.   11/02/2019 ALL: CHEDVA ALLEYNE is a 62 y.o. female here today for follow up for headaches. She took nortriptyline 10mg  for about a week and felt that  constipation was much worse. She weaned medication. She tried taking it again around the first of the year and constipation returned. She feels that overall, she is doing ok. Headaches are fairly well managed. She has used CBD and feels that it helps more than anything else she has tried. She is feeling well today and without complaints.    HISTORY: (copied from Dr Zannie Cove note on 04/07/2019)   Brad has been followed up with our clinic for her chronic migraine with aura, also has history of fibromyalgia.   She continue have frequent dizziness spells,  also typical migraine headaches, about once or twice each week, she describes sudden onset transient vertigo sensation, with tinnitus, sometimes associated with her lateralized migraine headaches, light noise sensitivity, nauseous, can lasting for hours to days, sometimes her migraine is proceeded with visual auras,   Her sister, all 3 of her children has typical migraine headaches, sometimes with visual auras   She went through a lot of stress at the end of 2019, lost few family members   UPDATE Apr 07 2019: She continues to have frequent headaches, migraines twice a week, lateralized severe pounding headache with light noise sensitivity, Maxalt works well, takes away her headache in 30 to 60 minutes, she has been taking magnesium oxide and riboflavin, did not try Topamax as prescribed, worried about side effect   She complains a lot of stress,    REVIEW OF SYSTEMS: Out of a complete 14 system review of symptoms, the patient complains only of the following symptoms, headaches, fatigue, pain and all other reviewed systems are negative.   ALLERGIES: Allergies  Allergen Reactions   Nsaids    Sulfa Antibiotics Hives    dizzness     HOME MEDICATIONS: Outpatient Medications Prior to Visit  Medication Sig Dispense Refill   Ascorbic Acid (VITAMIN C) 1000 MG tablet Take 1,000 mg by mouth daily.     b complex vitamins capsule Take 1 capsule by  mouth daily.     Boswellia Serrata (BOSWELLIA PO) Take by mouth.     Cholecalciferol ([employer] VITAMIN D3 DROPS/INFANT PO) Take 4,000 Units by mouth. Twice weekly     Coenzyme Q10 (COQ-10 PO) Take by mouth.     Cyanocobalamin (VITAMIN B-12 PO) Take 2,000 mg by mouth.     cyclobenzaprine (FLEXERIL) 10 MG tablet Take 1 tablet (10 mg total) by mouth 2 (two) times daily. Muscle spasms 180 tablet 3   Ginger, Zingiber officinalis, (GINGER PO) Take by mouth.     Magnesium 100 MG CAPS Take 600 mg by mouth.     Melatonin 1 MG TABS Take 3 mg by mouth.      MELATONIN-LEMON BALM PO Take by mouth.     Menthol, Topical Analgesic, (BIOFREEZE EX) Apply topically. Biofreeze - up to four times daily.     Multiple Vitamins-Minerals (MULTIVITAMIN PO) Take by mouth daily.     NON FORMULARY CBD and delta 8 PRN     NONFORMULARY OR COMPOUNDED ITEM CBD GUMMY AS NEEDED     ondansetron (ZOFRAN) 4 MG tablet Take 1 tablet (4 mg total) by  mouth every 8 (eight) hours as needed for nausea or vomiting. 20 tablet 6   OVER THE COUNTER MEDICATION as needed. Delta 8     Probiotic Product (PROBIOTIC DAILY PO) Take by mouth.     Riboflavin 400 MG TABS riboflavin (vitamin B2) 400 mg tablet  Take by oral route.     Turmeric (QC TUMERIC COMPLEX PO) Take by mouth.     rizatriptan (MAXALT-MLT) 10 MG disintegrating tablet PLACE 1 TABLET ON TONGUE AT ONSET OF MIGRAINE, MAY REPEAT ONCE IN 2 HOURS IF NEEDED 36 tablet 3   zolmitriptan (ZOMIG ZMT) 5 MG disintegrating tablet Take 1 tablet (5 mg total) by mouth as needed for migraine. 10 tablet 11   BIOTIN PO Take by mouth daily. (Patient not taking: Reported on 02/02/2023)     gabapentin (NEURONTIN) 300 MG capsule Take 300mg  daily at bedtime, may take 300mg  as needed in the mornings for headaches (Patient not taking: Reported on 02/02/2023) 180 capsule 3   PENNSAID 2 % SOLN apply ONE pump TO THE affected AREA two TIMES daily AS NEEDED (Patient not taking: Reported on 02/02/2023)  1   tiZANidine  (ZANAFLEX) 4 MG tablet Take 1 tablet (4 mg total) by mouth every 6 (six) hours as needed. (Patient not taking: Reported on 02/02/2023) 30 tablet 6   venlafaxine XR (EFFEXOR-XR) 37.5 MG 24 hr capsule TAKE 1 CAPSULE BY MOUTH DAILY WITH BREAKFAST. (Patient not taking: Reported on 02/02/2023) 90 capsule 4   No facility-administered medications prior to visit.     PAST MEDICAL HISTORY: Past Medical History:  Diagnosis Date   Arthritis R hip   Bursitis of left shoulder    hx of   Cancer (HCC)    kidney cancer   Chest pain    Fibromyalgia    Gastric ulcer    hx of   GERD (gastroesophageal reflux disease)    Headache(784.0) migraines   Hearing loss    Right    Migraines    Rosacea    Tietze syndrome    Vitamin D deficiency      PAST SURGICAL HISTORY: Past Surgical History:  Procedure Laterality Date   ABDOMINAL HYSTERECTOMY  2009   c section  1997   CYSTOCELE REPAIR  2007   D&c x2     DILATION AND CURETTAGE OF UTERUS  2956,2130   x2   Diliation and Coutar     ESOPHAGOGASTRODUODENOSCOPY  03/24/2012   Procedure: ESOPHAGOGASTRODUODENOSCOPY (EGD);  Surgeon: Rachael Fee, MD;  Location: [facility];  Service: Endoscopy;  Laterality: N/A;   RECTOCELE REPAIR  2007   ROBOT ASSISTED LAPAROSCOPIC NEPHRECTOMY Left 11/26/2012   Procedure: ROBOTIC ASSISTED LAPAROSCOPIC NEPHRECTOMY;  Surgeon: Sebastian Ache, MD;  Location: [facility];  Service: Urology;  Laterality: Left;     FAMILY HISTORY: Family History  Problem Relation Age of Onset   Dementia Mother    Heart attack Father    Kidney cancer Brother    Cervical cancer Sister      SOCIAL HISTORY: Social History   Socioeconomic History   Marital status: Married    Spouse name: martin   Number of children: 3   Years of education: college   Highest education level: Not on file  Occupational History    Comment: Home maker  Tobacco Use   Smoking status: Never   Smokeless tobacco: Never  Vaping Use   Vaping status: Never  Used  Substance and Sexual Activity   Alcohol use: Yes    Comment:  once or twice a year    Drug use: No   Sexual activity: Not on file  Other Topics Concern   Not on file  Social History Narrative   Patient lives at home with her husband.    Patient has 3 children.    Patient is currently a homemaker.    Patient is right handed.          Social Determinants of Health   Financial Resource Strain: Not on file  Food Insecurity: Not on file  Transportation Needs: Not on file  Physical Activity: Not on file  Stress: Not on file  Social Connections: Not on file  Intimate Partner Violence: Not on file      PHYSICAL EXAM  Vitals:   02/02/23 0957  BP: 128/65  Pulse: 67  Weight: 141 lb (64 kg)  Height: 5\' 6"  (1.676 m)     Body mass index is 22.76 kg/m.   Generalized: Well developed, in no acute distress  Cardiology: normal rate and rhythm, no murmur auscultated  Respiratory: clear to auscultation bilaterally    Neurological examination  Mentation: Alert oriented to time, place, history taking. Follows all commands speech and language fluent Cranial nerve II-XII: Pupils were equal round reactive to light. Extraocular movements were full, visual field were full on confrontational test. Facial sensation and strength were normal. Head turning and shoulder shrug were normal and symmetric. Motor: The motor testing reveals 5 over 5 strength of all 4 extremities. Good symmetric motor tone is noted throughout.  Gait and station: Gait is normal.     DIAGNOSTIC DATA (LABS, IMAGING, TESTING) - I reviewed patient records, labs, notes, testing and imaging myself where available.  Lab Results  Component Value Date   WBC 5.0 11/22/2012   HGB 10.5 (L) 11/27/2012   HCT 30.7 (L) 11/27/2012   MCV 91.6 11/22/2012   PLT 330 11/22/2012      Component Value Date/Time   NA 133 (L) 11/29/2012 0430   K 4.3 11/29/2012 0430   CL 99 11/29/2012 0430   CO2 30 11/29/2012 0430    GLUCOSE 109 (H) 11/29/2012 0430   BUN 8 11/29/2012 0430   CREATININE 0.80 08/03/2019 0933   CALCIUM 9.1 11/29/2012 0430   PROT 6.2 03/23/2012 2010   ALBUMIN 3.4 (L) 03/23/2012 2010   AST 14 03/23/2012 2010   ALT 12 03/23/2012 2010   ALKPHOS 44 03/23/2012 2010   BILITOT 0.3 03/23/2012 2010   GFRNONAA 62 (L) 11/29/2012 0430   GFRAA 72 (L) 11/29/2012 0430   No results found for: "CHOL", "HDL", "LDLCALC", "LDLDIRECT", "TRIG", "CHOLHDL" No results found for: "HGBA1C" No results found for: "VITAMINB12" Lab Results  Component Value Date   TSH 2.95 08/01/2020      ASSESSMENT AND PLAN  62 y.o. year old female  has a past medical history of Arthritis (R hip), Bursitis of left shoulder, Cancer (HCC), Chest pain, Fibromyalgia, Gastric ulcer, GERD (gastroesophageal reflux disease), Headache(784.0) (migraines), Hearing loss, Migraines, Rosacea, Tietze syndrome, and Vitamin D deficiency. here with   Migraine with aura and without status migrainosus, not intractable  Memory loss  Dizziness  Amaiya feels that headache severity and frequency is better over the past few months. She prefers to continue rizatriptan as needed. MRI unremarkable. MoCA 28/30 at last visit. She could consider formal neurocognitive testing if she wishes. I do not suspect neurodegenerative memory loss at this time. Healthy lifestyle habits advised. I have encouraged her to follow-up closely with her primary care provider  for management of fibromyalgia should skin pain or fatigue worsen.  Healthy lifestyle habits encouraged.  She will follow-up in 1 year, sooner if needed.  She verbalizes understanding and agreement with this plan.   I spent 30 minutes of face-to-face and non-face-to-face time with patient.  This included previsit chart review, lab review, study review, order entry, electronic health record documentation, patient education.    Shawnie Dapper, MSN, FNP-C 02/02/2023, 10:53 AM  [facility] [address] [place], [place] [address] [phone]

## 2023-01-29 NOTE — Patient Instructions (Addendum)
Below is our plan:  We will continue rizatriptan as needed. You could consider seeking neurocognitive eval with [facility], Jenna Renfew.   Please make sure you are staying well hydrated. I recommend 50-60 ounces daily. Well balanced diet and regular exercise encouraged. Consistent sleep schedule with 6-8 hours recommended.   Please continue follow up with care team as directed.   Follow up with me in 1 year, sooner if needed.   You may receive a survey regarding today's visit. I encourage you to leave honest feed back as I do use this information to improve patient care. Thank you for seeing me today!   GENERAL HEADACHE INFORMATION:   Natural supplements: Magnesium Oxide or Magnesium Glycinate 500 mg at bed (up to 800 mg daily) Coenzyme Q10 300 mg in AM Vitamin B2- 200 mg twice a day   Add 1 supplement at a time since even natural supplements can have undesirable side effects. You can sometimes buy supplements cheaper (especially Coenzyme Q10) at www.WebmailGuide.co.za or at [facility].  Migraine with aura: There is increased risk for stroke in women with migraine with aura and a contraindication for the combined contraceptive pill for use by women who have migraine with aura. The risk for women with migraine without aura is lower. However other risk factors like smoking are far more likely to increase stroke risk than migraine. There is a recommendation for no smoking and for the use of OCPs without estrogen such as progestogen only pills particularly for women with migraine with aura.Cristina Sanchez People who have migraine headaches with auras may be 3 times more likely to have a stroke caused by a blood clot, compared to migraine patients who don't see auras. Women who take hormone-replacement therapy may be 30 percent more likely to suffer a clot-based stroke than women not taking medication containing estrogen. Other risk factors like smoking and high blood pressure may be  much more important.     Vitamins and herbs that show potential:   Magnesium: Magnesium (250 mg twice a day or 500 mg at bed) has a relaxant effect on smooth muscles such as blood vessels. Individuals suffering from frequent or daily headache usually have low magnesium levels which can be increase with daily supplementation of 400-750 mg. Three trials found 40-90% average headache reduction  when used as a preventative. Magnesium may help with headaches are aura, the best evidence for magnesium is for migraine with aura is its thought to stop the cortical spreading depression we believe is the pathophysiology of migraine aura.Magnesium also demonstrated the benefit in menstrually related migraine.  Magnesium is part of the messenger system in the serotonin cascade and it is a good muscle relaxant.  It is also useful for constipation which can be a side effect of other medications used to treat migraine. Good sources include nuts, whole grains, and tomatoes. Side Effects: loose stool/diarrhea  Riboflavin (vitamin B 2) 200 mg twice a day. This vitamin assists nerve cells in the production of ATP a principal energy storing molecule.  It is necessary for many chemical reactions in the body.  There have been at least 3 clinical trials of riboflavin using 400 mg per day all of which suggested that migraine frequency can be decreased.  All 3 trials showed significant improvement in over half of migraine sufferers.  The supplement is found in bread, cereal, milk, meat, and poultry.  Most Americans get more riboflavin than the recommended daily allowance, however riboflavin deficiency is not necessary for the supplements  to help prevent headache. Side effects: energizing, green urine   Coenzyme Q10: This is present in almost all cells in the body and is critical component for the conversion of energy.  Recent studies have shown that a nutritional supplement of CoQ10 can reduce the frequency of migraine attacks by improving the energy  production of cells as with riboflavin.  Doses of 150 mg twice a day have been shown to be effective.   Melatonin: Increasing evidence shows correlation between melatonin secretion and headache conditions.  Melatonin supplementation has decreased headache intensity and duration.  It is widely used as a sleep aid.  Sleep is natures way of dealing with migraine.  A dose of 3 mg is recommended to start for headaches including cluster headache. Higher doses up to 15 mg has been reviewed for use in Cluster headache and have been used. The rationale behind using melatonin for cluster is that many theories regarding the cause of Cluster headache center around the disruption of the normal circadian rhythm in the brain.  This helps restore the normal circadian rhythm.   HEADACHE DIET: Foods and beverages which may trigger migraine Note that only 20% of headache patients are food sensitive. You will know if you are food sensitive if you get a headache consistently 20 minutes to 2 hours after eating a certain food. Only cut out a food if it causes headaches, otherwise you might remove foods you enjoy! What matters most for diet is to eat a well balanced healthy diet full of vegetables and low fat protein, and to not miss meals.   Chocolate, other sweets ALL cheeses except cottage and cream cheese Dairy products, yogurt, sour cream, ice cream Liver Meat extracts (Bovril, Marmite, meat tenderizers) Meats or fish which have undergone aging, fermenting, pickling or smoking. These include: Hotdogs,salami,Lox,sausage, mortadellas,smoked salmon, pepperoni, Pickled herring Pods of broad bean (English beans, Chinese pea pods, [place] (fava) beans, lima and navy beans Ripe avocado, ripe banana Yeast extracts or active yeast preparations such as Brewer's or Fleishman's (commercial bakes goods are permitted) Tomato based foods, pizza (lasagna, etc.)   MSG (monosodium glutamate) is disguised as many things; look for  these common aliases: Monopotassium glutamate Autolysed yeast Hydrolysed protein Sodium caseinate "flavorings" "all natural preservatives" Nutrasweet   Avoid all other foods that convincingly provoke headaches.   Resources: The Dizzy [place]air Laundry Your Headache Diet, migrainestrong.com  https://zamora-andrews.com/   Caffeine and Migraine For patients that have migraine, caffeine intake more than 3 days per week can lead to dependency and increased migraine frequency. I would recommend cutting back on your caffeine intake as best you can. The recommended amount of caffeine is 200-300 mg daily, although migraine patients may experience dependency at even lower doses. While you may notice an increase in headache temporarily, cutting back will be helpful for headaches in the long run. For more information on caffeine and migraine, visit: https://americanmigrainefoundation.org/resource-library/caffeine-and-migraine/   Headache Prevention Strategies:   1. Maintain a headache diary; learn to identify and avoid triggers.  - This can be a simple note where you log when you had a headache, associated symptoms, and medications used - There are several smartphone apps developed to help track migraines: Migraine Buddy, Migraine Monitor, Curelator N1-Headache App   Common triggers include: Emotional triggers: Emotional/Upset family or friends Emotional/Upset occupation Business reversal/success Anticipation anxiety Crisis-serious Post-crisis periodNew job/position   Physical triggers: Vacation Day Weekend Strenuous Exercise High Altitude Location New Move Menstrual Day Physical Illness Oversleep/Not enough sleep Weather changes Light: Photophobia  or light sesnitivity treatment involves a balance between desensitization and reduction in overly strong input. Use dark polarized glasses outside, but not inside. Avoid bright or fluorescent light, but  do not dim environment to the point that going into a normally lit room hurts. Consider FL-41 tint lenses, which reduce the most irritating wavelengths without blocking too much light.  These can be obtained at axonoptics.com or theraspecs.com Foods: see list above.   2. Limit use of acute treatments (over-the-counter medications, triptans, etc.) to no more than 2 days per week or 10 days per month to prevent medication overuse headache (rebound headache).     3. Follow a regular schedule (including weekends and holidays): Don't skip meals. Eat a balanced diet. 8 hours of sleep nightly. Minimize stress. Exercise 30 minutes per day. Being overweight is associated with a 5 times increased risk of chronic migraine. Keep well hydrated and drink 6-8 glasses of water per day.   4. Initiate non-pharmacologic measures at the earliest onset of your headache. Rest and quiet environment. Relax and reduce stress. Breathe2Relax is a free app that can instruct you on    some simple relaxtion and breathing techniques. Http://Dawnbuse.com is a    free website that provides teaching videos on relaxation.  Also, there are  many apps that   can be downloaded for "mindful" relaxation.  An app called YOGA NIDRA will help walk you through mindfulness. Another app called Calm can be downloaded to give you a structured mindfulness guide with daily reminders and skill development. Headspace for guided meditation Mindfulness Based Stress Reduction Online Course: www.palousemindfulness.com Cold compresses.   5. Don't wait!! Take the maximum allowable dosage of prescribed medication at the first sign of migraine.   6. Compliance:  Take prescribed medication regularly as directed and at the first sign of a migraine.   7. Communicate:  Call your physician when problems arise, especially if your headaches change, increase in frequency/severity, or become associated with neurological symptoms (weakness, numbness, slurred  speech, etc.). Proceed to emergency room if you experience new or worsening symptoms or symptoms do not resolve, if you have new neurologic symptoms or if headache is severe, or for any concerning symptom.   8. Headache/pain management therapies: Consider various complementary methods, including medication, behavioral therapy, psychological counselling, biofeedback, massage therapy, acupuncture, dry needling, and other modalities.  Such measures may reduce the need for medications. Counseling for pain management, where patients learn to function and ignore/minimize their pain, seems to work very well.   9. Recommend changing family's attention and focus away from patient's headaches. Instead, emphasize daily activities. If first question of day is 'How are your headaches/Do you have a headache today?', then patient will constantly think about headaches, thus making them worse. Goal is to re-direct attention away from headaches, toward daily activities and other distractions.   10. Helpful Websites: www.AmericanHeadacheSociety.org PatentHood.ch www.headaches.org TightMarket.nl www.achenet.org   Management of Memory Problems   There are some general things you can do to help manage your memory problems.  Your memory may not in fact recover, but by using techniques and strategies you will be able to manage your memory difficulties better.   1)  Establish a routine. Try to establish and then stick to a regular routine.  By doing this, you will get used to what to expect and you will reduce the need to rely on your memory.  Also, try to do things at the same time of day, such as taking your medication or  checking your calendar first thing in the morning. Think about think that you can do as a part of a regular routine and make a list.  Then enter them into a daily planner to remind you.  This will help you establish a routine.   2)  Organize your environment. Organize your environment  so that it is uncluttered.  Decrease visual stimulation.  Place everyday items such as keys or cell phone in the same place every day (ie.  Basket next to front door) Use post it notes with a brief message to yourself (ie. Turn off light, lock the door) Use labels to indicate where things go (ie. Which cupboards are for food, dishes, etc.) Keep a notepad and pen by the telephone to take messages   3)  Memory Aids A diary or journal/notebook/daily planner Making a list (shopping list, chore list, to do list that needs to be done) Using an alarm as a reminder (Sanchez timer or cell phone alarm) Using cell phone to store information (Notes, Calendar, Reminders) Calendar/White board placed in a prominent position Post-it notes   In order for memory aids to be useful, you need to have good habits.  It's no good remembering to make a note in your journal if you don't remember to look in it.  Try setting aside a certain time of day to look in journal.   4)  Improving mood and managing fatigue. There may be other factors that contribute to memory difficulties.  Factors, such as anxiety, depression and tiredness can affect memory. Regular gentle exercise can help improve your mood and give you more energy. Exercise: there are short videos created by the [employer] on Health specially for older adults: https://bit.ly/2I30q97.  Mediterranean diet: which emphasizes fruits, vegetables, whole grains, legumes, fish, and other seafood; unsaturated fats such as olive oils; and low amounts of red meat, eggs, and sweets. A variation of this, called MIND (Mediterranean-DASH Intervention for Neurodegenerative Delay) incorporates the DASH (Dietary Approaches to Stop Hypertension) diet, which has been shown to lower high blood pressure, a risk factor for Alzheimer's disease. More information at: ExitMarketing.de.  Aerobic exercise that  improve heart health is also good for the mind.  [employer] on Aging have short videos for exercises that you can do at home: BlindWorkshop.com.pt Simple relaxation techniques may help relieve symptoms of anxiety Try to get back to completing activities or hobbies you enjoyed doing in the past. Learn to pace yourself through activities to decrease fatigue. Find out about some local support groups where you can share experiences with others. Try and achieve 7-8 hours of sleep at night.   Tasks to improve attention/working memory 1. Good sleep hygiene (7-8 hrs of sleep) 2. Learning a new skill (Painting, Carpentry, Pottery, new language, Knitting). 3.Cognitive exercises (keep a daily journal, Puzzles) 4. Physical exercise and training  (30 min/day X 4 days week) 5. Being on Antidepressant if needed 6.Yoga, Meditation, Tai Chi 7. Decrease alcohol intake 8.Have a clear schedule and structure in daily routine   MIND Diet: The Mediterranean-DASH Diet Intervention for Neurodegenerative Delay, or MIND diet, targets the health of the aging brain. Research participants with the highest MIND diet scores had a significantly slower rate of cognitive decline compared with those with the lowest scores. The effects of the MIND diet on cognition showed greater effects than either the Mediterranean or the DASH diet alone.   The healthy items the MIND diet guidelines suggest include:   3+ servings a day  of whole grains 1+ servings a day of vegetables (other than green leafy) 6+ servings a week of green leafy vegetables 5+ servings a week of nuts 4+ meals a week of beans 2+ servings a week of berries 2+ meals a week of poultry 1+ meals a week of fish Mainly olive oil if added fat is used   The unhealthy items, which are higher in saturated and trans fat, include: Less than 5 servings a week of pastries and sweets Less than 4 servings a week of red meat (including beef, pork, lamb, and  products made from these meats) Less than one serving a week of cheese and fried foods Less than 1 tablespoon a day of butter/stick margarine

## 2023-02-02 ENCOUNTER — Ambulatory Visit: Payer: BC Managed Care – PPO | Admitting: Family Medicine

## 2023-02-02 ENCOUNTER — Encounter: Payer: Self-pay | Admitting: Family Medicine

## 2023-02-02 VITALS — BP 128/65 | HR 67 | Ht 66.0 in | Wt 141.0 lb

## 2023-02-02 DIAGNOSIS — G43109 Migraine with aura, not intractable, without status migrainosus: Secondary | ICD-10-CM | POA: Diagnosis not present

## 2023-02-02 DIAGNOSIS — R413 Other amnesia: Secondary | ICD-10-CM

## 2023-02-02 DIAGNOSIS — R42 Dizziness and giddiness: Secondary | ICD-10-CM | POA: Diagnosis not present

## 2023-02-02 MED ORDER — RIZATRIPTAN BENZOATE 10 MG PO TBDP: ORAL_TABLET | ORAL | 11 refills | Status: DC

## 2023-04-09 DIAGNOSIS — H04123 Dry eye syndrome of bilateral lacrimal glands: Secondary | ICD-10-CM | POA: Diagnosis not present

## 2023-04-09 DIAGNOSIS — H2513 Age-related nuclear cataract, bilateral: Secondary | ICD-10-CM | POA: Diagnosis not present

## 2023-05-18 DIAGNOSIS — R590 Localized enlarged lymph nodes: Secondary | ICD-10-CM | POA: Diagnosis not present

## 2023-05-18 DIAGNOSIS — Z6823 Body mass index (BMI) 23.0-23.9, adult: Secondary | ICD-10-CM | POA: Diagnosis not present

## 2023-05-18 DIAGNOSIS — H9202 Otalgia, left ear: Secondary | ICD-10-CM | POA: Diagnosis not present

## 2023-06-18 DIAGNOSIS — H2512 Age-related nuclear cataract, left eye: Secondary | ICD-10-CM | POA: Diagnosis not present

## 2023-06-30 DIAGNOSIS — H2511 Age-related nuclear cataract, right eye: Secondary | ICD-10-CM | POA: Diagnosis not present

## 2023-07-02 DIAGNOSIS — H2511 Age-related nuclear cataract, right eye: Secondary | ICD-10-CM | POA: Diagnosis not present

## 2023-09-11 ENCOUNTER — Telehealth: Payer: Self-pay | Admitting: Family Medicine

## 2023-09-11 NOTE — Telephone Encounter (Signed)
 Pt is asking to be called to discuss any suggestions available re: her worsening migraines.  Pt accepted an appointment with Dr Terrace Arabia, pt is asking to not be contacted on my chart as it is not working for her at this time.

## 2023-09-11 NOTE — Telephone Encounter (Signed)
 Call to patient, she reports worsening migraines.she had cataract surgery in January and photophobia had been worse since then and pataday was given but didn't help. The eye Dr. Rhina Brackett in fluorometholone on Tuesday of this week and she take 4x daily. She is going back Tuesday to have pressure checked. She feels like this has helped some. She takes maxalt as needed but is afraid to use more than 2x in one week. Sh currently doesn't have a migraine. I advised to continue the treatment as prescribed by eye MD and follow back up next week. I advised I would send to work in MD for any other suggestions.Patient apprecitave of call

## 2023-09-11 NOTE — Telephone Encounter (Signed)
 Call to patient, she reports she isn't  sure if she has that medication or if wiling to try again but will follow back up if needed.

## 2023-10-27 DIAGNOSIS — Z961 Presence of intraocular lens: Secondary | ICD-10-CM | POA: Diagnosis not present

## 2023-10-27 DIAGNOSIS — H53143 Visual discomfort, bilateral: Secondary | ICD-10-CM | POA: Diagnosis not present

## 2023-10-30 DIAGNOSIS — H903 Sensorineural hearing loss, bilateral: Secondary | ICD-10-CM | POA: Diagnosis not present

## 2023-11-13 ENCOUNTER — Emergency Department (HOSPITAL_COMMUNITY)
Admission: EM | Admit: 2023-11-13 | Discharge: 2023-11-14 | Disposition: A | Discharge status: Home / Self Care | Attending: Emergency Medicine | Admitting: Emergency Medicine

## 2023-11-13 ENCOUNTER — Other Ambulatory Visit: Payer: Self-pay

## 2023-11-13 ENCOUNTER — Emergency Department (HOSPITAL_COMMUNITY)

## 2023-11-13 ENCOUNTER — Encounter (HOSPITAL_COMMUNITY): Payer: Self-pay

## 2023-11-13 DIAGNOSIS — W1849XA Other slipping, tripping and stumbling without falling, initial encounter: Secondary | ICD-10-CM | POA: Diagnosis not present

## 2023-11-13 DIAGNOSIS — S92511A Displaced fracture of proximal phalanx of right lesser toe(s), initial encounter for closed fracture: Secondary | ICD-10-CM | POA: Diagnosis not present

## 2023-11-13 DIAGNOSIS — M79674 Pain in right toe(s): Secondary | ICD-10-CM | POA: Diagnosis not present

## 2023-11-13 NOTE — ED Triage Notes (Signed)
 Patient states she broke her toe around 1730 on the right foot. Patient state she was hanging things in her husbands closet tripped over the exercise machine. States it is the 4th digit that is bent to the side, however the other toes appear to be swollen awell. Patient took 1 500mg  tylenol  at 1830.

## 2023-11-13 NOTE — ED Notes (Signed)
Sent urine sample to the lab.

## 2023-11-14 DIAGNOSIS — S92511A Displaced fracture of proximal phalanx of right lesser toe(s), initial encounter for closed fracture: Secondary | ICD-10-CM | POA: Diagnosis not present

## 2023-11-14 MED ORDER — LIDOCAINE HCL (PF) 1 % IJ SOLN
30.0000 mL | Freq: Once | INTRAMUSCULAR | Status: DC
  Filled 2023-11-14: qty 30

## 2023-11-14 MED ORDER — LIDOCAINE-PRILOCAINE 2.5-2.5 % EX CREA
TOPICAL_CREAM | Freq: Once | CUTANEOUS | Status: AC
  Administered 2023-11-14: 1 via TOPICAL
  Filled 2023-11-14: qty 5

## 2023-11-14 NOTE — Discharge Instructions (Signed)
 Please keep the toe "buddy-taped" for the next 4-6 weeks.  Use the post-op shoe until you are no longer having pain, but I recommend at least 2-3 weeks.  Use ice and elevation to help with swelling.

## 2023-11-14 NOTE — ED Provider Notes (Signed)
 [facility] Emergency Department Provider Note MRN:  [id number]  Arrival date & time: 11/14/23     Chief Complaint   Toe Injury   History of Present Illness   Cristina Sanchez is a 63 y.o. year-old female presents to the ED with chief complaint of toe pain.  She states that she stubbed her town on a piece of exercise equipment.  She reports moderate pain with associated swelling.  It is worsened with palpation, movement, and ambulation.  She denies any other injuries.  History provided by patient.   Review of Systems  Pertinent positive and negative review of systems noted in HPI.    Physical Exam   Vitals:   11/13/23 1921 11/14/23 0030  BP: 115/72 (!) 141/80  Pulse: 71 62  Resp: 20 16  Temp: 98 F (36.7 C) 97.9 F (36.6 C)  SpO2: 99% 100%    CONSTITUTIONAL:  non toxic-appearing, NAD NEURO:  Alert and oriented x 3, CN 3-12 grossly intact EYES:  eyes equal and reactive ENT/NECK:  Supple, no stridor  CARDIO:  appears well-perfused, intact distal pulses PULM:  No respiratory distress,  GI/GU:  non-distended,  MSK/SPINE:  Right 4th toe is swollen and angulated laterally SKIN:  no rash, atraumatic   *Additional and/or pertinent findings included in MDM below  Diagnostic and Interventional Summary    EKG Interpretation Date/Time:    Ventricular Rate:    PR Interval:    QRS Duration:    QT Interval:    QTC Calculation:   R Axis:      Text Interpretation:         Labs Reviewed - No data to display  DG Foot Complete Right  Final Result      Medications  lidocaine  (PF) (XYLOCAINE ) 1 % injection 30 mL (has no administration in time range)  lidocaine -prilocaine (EMLA) cream (1 Application Topical Given 11/14/23 0055)     Procedures  /  Critical Care .Ortho Injury Treatment  Date/Time: 11/14/2023 2:03 AM  Performed by: Sherel Dikes, PA-C Authorized by: Sherel Dikes, PA-C   Consent:    Consent obtained:  Verbal   Consent  given by:  Patient   Alternatives discussed:  No treatmentInjury location: toe Location details: right fourth toe Injury type: fracture Fracture type: proximal phalanx Pre-procedure neurovascular assessment: neurovascularly intact Pre-procedure distal perfusion: normal Pre-procedure neurological function: normal Pre-procedure range of motion: reduced Anesthesia: digital block  Anesthesia: Local anesthesia used: yes Local Anesthetic: lidocaine  1% without epinephrine Anesthetic total: 3 mL  Patient sedated: NoManipulation performed: yes Skin traction used: no Skeletal traction used: no Reduction successful: yes Immobilization: tape Splint Applied by: ED Provider Supplies used: cotton padding Post-procedure neurovascular assessment: post-procedure neurovascularly intact Post-procedure distal perfusion: normal Post-procedure neurological function: normal Post-procedure range of motion: unchanged     ED Course and Medical Decision Making  I have reviewed the triage vital signs, the nursing notes, and pertinent available records from the EMR.  Social Determinants Affecting Complexity of Care: Patient has no clinically significant social determinants affecting this chief complaint..   ED Course:    Medical Decision Making Amount and/or Complexity of Data Reviewed Radiology: ordered.  Risk Prescription drug management.         Consultants: No consultations were needed in caring for this patient.   Treatment and Plan: Emergency department workup does not suggest an emergent condition requiring admission or immediate intervention beyond  what has been performed at this time. The patient is safe for discharge  and has  been instructed to return immediately for worsening symptoms, change in  symptoms or any other concerns    Final Clinical Impressions(s) / ED Diagnoses     ICD-10-CM   1. Closed displaced fracture of proximal phalanx of lesser toe of right foot,  initial encounter  Z61.096E       ED Discharge Orders     None         Discharge Instructions Discussed with and Provided to Patient:     Discharge Instructions      Please keep the toe "buddy-taped" for the next 4-6 weeks.  Use the post-op shoe until you are no longer having pain, but I recommend at least 2-3 weeks.  Use ice and elevation to help with swelling.     Sherel Dikes, PA-C 11/14/23 0205    Lindle Rhea, MD 11/14/23 0800

## 2023-11-16 ENCOUNTER — Ambulatory Visit (INDEPENDENT_AMBULATORY_CARE_PROVIDER_SITE_OTHER)

## 2023-11-16 ENCOUNTER — Encounter: Payer: Self-pay | Admitting: Podiatry

## 2023-11-16 ENCOUNTER — Ambulatory Visit: Admitting: Podiatry

## 2023-11-16 DIAGNOSIS — S92511D Displaced fracture of proximal phalanx of right lesser toe(s), subsequent encounter for fracture with routine healing: Secondary | ICD-10-CM | POA: Diagnosis not present

## 2023-11-18 NOTE — Progress Notes (Signed)
 Subjective:   Patient ID: Cristina Sanchez, female   DOB: 63 y.o.   MRN: [id number]   HPI Patient presents stating she broke her toe 4 days ago right and has been very sore and hard to wear shoe gear with.  She is now splinting into the next toe and it seems to be better.  Patient does not smoke likes to be active   Review of Systems  All other systems reviewed and are negative.       Objective:  Physical Exam Vitals and nursing note reviewed.  Constitutional:      Appearance: She is well-developed.  Pulmonary:     Effort: Pulmonary effort is normal.  Musculoskeletal:        General: Normal range of motion.  Skin:    General: Skin is warm.  Neurological:     Mental Status: She is alert.     Neurovascular status intact muscle strength found to be adequate range of motion within normal limits with patient noted to have a buttressed fourth toe against the third toe right with reasonably good position with some abnormal position on previous x-ray.  Good digital perfusion well-oriented \     Assessment:  Fracture of the fourth digit right foot with possible malposition but has been splinted     Plan:  H&P reviewed position looks good now and I have recommended that we just splint the toe for the next 3 to 4 weeks fourth toe to third toe and will be seen back as needed  X-rays indicate good alignment of the fourth digit against the third digit and it has improved quite a bit from the x-ray of a number of days ago

## 2023-11-26 ENCOUNTER — Encounter: Payer: Self-pay | Admitting: Neurology

## 2023-11-26 ENCOUNTER — Ambulatory Visit: Admitting: Neurology

## 2023-11-26 VITALS — BP 115/70 | HR 86 | Resp 16 | Ht 66.0 in | Wt 141.5 lb

## 2023-11-26 DIAGNOSIS — G43109 Migraine with aura, not intractable, without status migrainosus: Secondary | ICD-10-CM

## 2023-11-26 MED ORDER — RIZATRIPTAN BENZOATE 10 MG PO TBDP
ORAL_TABLET | ORAL | 11 refills | Status: DC
Start: 2023-11-26 — End: 2024-02-02

## 2023-11-26 NOTE — Progress Notes (Signed)
 Chief Complaint  Patient presents with   Migraine    Rm15, alone, Migraines: doing better according to pt. They were daily in feb and march, now migraines are only 12 days a month. Trigger: light, sleep, foods, sleep, stress      ASSESSMENT AND PLAN  Cristina Sanchez is a 63 y.o. female   Worsening chronic migraine headaches Anxiety  Normal MRI of brain  Magnesium oxide 400+ riboflavin 100 mg twice a day as migraine prevention  Maxalt  as needed,   Return To Clinic With NP In 6 Months   DIAGNOSTIC DATA (LABS, IMAGING, TESTING) - I reviewed patient records, labs, notes, testing and imaging myself where available.   MEDICAL HISTORY:  Cristina Sanchez, is here for follow-up constellation of complaints, chronic migraine, her primary care physician is Dr.   Katheleen Sanchez, Cristina Sanchez    I reviewed and summarized the referring note. PMHX. Anxiety Chronic migraine Fibromyalgia  [name] has been followed by our clinic for many years for chronic migraine headaches, over the years, she was noted to have significant anxiety, also complains of fibromyalgia  She is tearful at today's clinical visit, complains of very stressful few years since 2019, lost multiple family members,    With all the stress, she complains of increased anxiety, frequent dizziness, almost daily basis, described as severe headache, sometimes difficulty focusing, mild degree of daily headaches couple times each week it would exacerbated to almost severe pounding headache, require Maxalt , helps sometimes, but also she has to go to bed, suffer more prolonged even days of headache  Mother suffered vascular dementia died at age 3, she noticed word finding difficulties, mild short-term memory loss, she did not finish her college, 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  [facility] examination today is 57/30   UPDATE November 26 2023: She complains of frequent almost daily headache at the beginning of 2025, retro-orbital area severe headache with light,  noise, smell sensitivity, nauseous, lasting hours or days, Maxalt  as needed was helpful  She was evaluated by ophthalmologist, was given fluorometholone eyedrop, which did help her headache,  Even now, at her better stage, she is having 2-3 migraine each week, we have discussed daily preventative medications in the past, she is hesitant to be started even low-dose nortriptyline , Effexor , worry about the side effect, previously was treated with gabapentin  600 mg 3 times daily, did help her fibromyalgia and migraines  She apparently still have some anxiety, anxious coming to the doctor's office, after discussion, we decided to try over-the-counter magnesium oxide 400 mg twice a day plus riboflavin 100 mg twice a day as preventive medications  Her sister suffers severe migraine, is receiving Botox, Qulipta daily as migraine prevention   MRI brain was normal in March 2024.   PHYSICAL EXAM:   Vitals:   11/26/23 0849  BP: 115/70  Pulse: 86  Resp: 16  SpO2: 96%  Weight: 141 lb 8 oz (64.2 kg)  Height: 5' 6 (1.676 m)     Body mass index is 22.84 kg/m.  PHYSICAL EXAMNIATION:  Gen: NAD, conversant, well nourised, well groomed                     Cardiovascular: Regular rate rhythm, no peripheral edema, warm, nontender. Eyes: Conjunctivae clear without exudates or hemorrhage Neck: Supple, no carotid bruits. Pulmonary: Clear to auscultation bilaterally   NEUROLOGICAL EXAM:  MENTAL STATUS: Speech/cognition: Anxious tearful at today's clinical examination, oriented to history taking and casual conversation    07/31/2022  11:00 AM  Montreal Cognitive Assessment   Visuospatial/ Executive (0/5) 5  Naming (0/3) 3  Attention: Read list of digits (0/2) 1  Attention: Read list of letters (0/1) 1  Attention: Serial 7 subtraction starting at 100 (0/3) 3  Language: Repeat phrase (0/2) 2  Language : Fluency (0/1) 0  Abstraction (0/2) 2  Delayed Recall (0/5) 5  Orientation (0/6) 6   Total 28    CRANIAL NERVES: CN II: Visual fields are full to confrontation. Pupils are round equal and briskly reactive to light. CN III, IV, VI: extraocular movement are normal. No ptosis. CN V: Facial sensation is intact to light touch CN VII: Face is symmetric with normal eye closure  CN VIII: Hearing is normal to causal conversation. CN IX, X: Phonation is normal. CN XI: Head turning and shoulder shrug are intact  MOTOR: There is no pronator drift of out-stretched arms. Muscle bulk and tone are normal. Muscle strength is normal.  REFLEXES: Reflexes are 2+ and symmetric at the biceps, triceps, knees, and ankles. Plantar responses are flexor.  SENSORY: Intact to light touch, pinprick and vibratory sensation are intact in fingers and toes.  COORDINATION: There is no trunk or limb dysmetria noted.  GAIT/STANCE: Mildly antalgic due to recent right foot fracture  REVIEW OF SYSTEMS:  Full 14 system review of systems performed and notable only for as above All other review of systems were negative.   ALLERGIES: Allergies  Allergen Reactions   Nsaids    Sulfa Antibiotics Hives    dizzness    HOME MEDICATIONS: Current Outpatient Medications  Medication Sig Dispense Refill   acetaminophen  (TYLENOL ) 500 MG tablet Take 500 mg by mouth every 6 (six) hours as needed.     ASHWAGANDHA PO Take 1 tablet by mouth daily.     b complex vitamins capsule Take 1 capsule by mouth daily.     Boswellia Serrata (BOSWELLIA PO) Take by mouth.     Cholecalciferol (CVS VITAMIN D3 DROPS/INFANT PO) Take 4,000 Units by mouth. Twice weekly     Coenzyme Q10 (COQ-10 PO) Take by mouth.     Cyanocobalamin  (VITAMIN B-12 PO) Take 2,000 mg by mouth.     cyclobenzaprine  (FLEXERIL ) 10 MG tablet Take 1 tablet (10 mg total) by mouth 2 (two) times daily. Muscle spasms 180 tablet 3   Ginger, Zingiber officinalis, (GINGER PO) Take by mouth.     Ginkgo Biloba (GNP GINGKO BILOBA EXTRACT PO) Take 1 tablet by mouth  daily.     Magnesium 100 MG CAPS Take 600 mg by mouth.     Melatonin 1 MG TABS Take 3 mg by mouth.      MELATONIN-LEMON BALM PO Take by mouth.     Menthol, Topical Analgesic, (BIOFREEZE EX) Apply topically. Biofreeze - up to four times daily.     Multiple Vitamins-Minerals (MULTIVITAMIN PO) Take by mouth daily.     NON FORMULARY CBD and delta 8 PRN     NONFORMULARY OR COMPOUNDED ITEM CBD GUMMY AS NEEDED     ondansetron  (ZOFRAN ) 4 MG tablet Take 1 tablet (4 mg total) by mouth every 8 (eight) hours as needed for nausea or vomiting. 20 tablet 6   OVER THE COUNTER MEDICATION as needed. Delta 8     Probiotic Product (PROBIOTIC DAILY PO) Take by mouth.     Riboflavin 400 MG TABS riboflavin (vitamin B2) 400 mg tablet  Take by oral route.     rizatriptan  (MAXALT -MLT) 10 MG disintegrating tablet PLACE 1  TABLET ON TONGUE AT ONSET OF MIGRAINE, MAY REPEAT ONCE IN 2 HOURS IF NEEDED 10 tablet 11   Turmeric (QC TUMERIC COMPLEX PO) Take by mouth.     No current facility-administered medications for this visit.    PAST MEDICAL HISTORY: Past Medical History:  Diagnosis Date   Arthritis R hip   Bursitis of left shoulder    hx of   Cancer (HCC)    kidney cancer   Chest pain    Fibromyalgia    Gastric ulcer    hx of   GERD (gastroesophageal reflux disease)    Headache(784.0) migraines   Hearing loss    Right    Migraines    Rosacea    Tietze syndrome    Vitamin D deficiency     PAST SURGICAL HISTORY: Past Surgical History:  Procedure Laterality Date   ABDOMINAL HYSTERECTOMY  2009   c section  1997   CYSTOCELE REPAIR  2007   D&c x2     DILATION AND CURETTAGE OF UTERUS  1191,4782   x2   Diliation and Coutar     ESOPHAGOGASTRODUODENOSCOPY  03/24/2012   Procedure: ESOPHAGOGASTRODUODENOSCOPY (EGD);  Surgeon: Janel Medford, MD;  Location: [facility];  Service: Endoscopy;  Laterality: N/A;   RECTOCELE REPAIR  2007   ROBOT ASSISTED LAPAROSCOPIC NEPHRECTOMY Left 11/26/2012   Procedure:  ROBOTIC ASSISTED LAPAROSCOPIC NEPHRECTOMY;  Surgeon: Osborn Blaze, MD;  Location: [facility];  Service: Urology;  Laterality: Left;    FAMILY HISTORY: Family History  Problem Relation Age of Onset   Dementia Mother    Heart attack Father    Kidney cancer Brother    Cervical cancer Sister     SOCIAL HISTORY: Social History   Socioeconomic History   Marital status: Married    Spouse name: martin   Number of children: 3   Years of education: college   Highest education level: Not on file  Occupational History    Comment: Home maker  Tobacco Use   Smoking status: Never   Smokeless tobacco: Never  Vaping Use   Vaping status: Never Used  Substance and Sexual Activity   Alcohol use: Yes    Comment: once or twice a year    Drug use: No   Sexual activity: Not on file  Other Topics Concern   Not on file  Social History Narrative   Patient lives at home with her husband.    Patient has 3 children.    Patient is currently a homemaker.    Patient is right handed.          Social Drivers of Corporate investment banker Strain: Not on file  Food Insecurity: Not on file  Transportation Needs: Not on file  Physical Activity: Not on file  Stress: Not on file  Social Connections: Not on file  Intimate Partner Violence: Not on file      Phebe Brasil, M.D. Ph.D.  [facility] [address] [place], [place] [address] Ph: [phone] Fax: [phone]  CC:  Annamarie Kid, MD [address] [place],  [place] [address]  [facility]

## 2023-12-13 ENCOUNTER — Ambulatory Visit
Admission: RE | Admit: 2023-12-13 | Discharge: 2023-12-13 | Disposition: A | Discharge status: Home / Self Care | Payer: Self-pay | Source: Ambulatory Visit | Attending: Family Medicine | Admitting: Family Medicine

## 2023-12-13 ENCOUNTER — Ambulatory Visit (INDEPENDENT_AMBULATORY_CARE_PROVIDER_SITE_OTHER)

## 2023-12-13 ENCOUNTER — Ambulatory Visit (HOSPITAL_COMMUNITY)

## 2023-12-13 ENCOUNTER — Ambulatory Visit: Payer: Self-pay | Admitting: Urgent Care

## 2023-12-13 VITALS — BP 147/94 | HR 96 | Temp 97.5°F | Resp 16

## 2023-12-13 DIAGNOSIS — L03031 Cellulitis of right toe: Secondary | ICD-10-CM

## 2023-12-13 DIAGNOSIS — M79671 Pain in right foot: Secondary | ICD-10-CM

## 2023-12-13 DIAGNOSIS — S92511A Displaced fracture of proximal phalanx of right lesser toe(s), initial encounter for closed fracture: Secondary | ICD-10-CM | POA: Diagnosis not present

## 2023-12-13 DIAGNOSIS — M19071 Primary osteoarthritis, right ankle and foot: Secondary | ICD-10-CM | POA: Diagnosis not present

## 2023-12-13 DIAGNOSIS — M7731 Calcaneal spur, right foot: Secondary | ICD-10-CM | POA: Diagnosis not present

## 2023-12-13 MED ORDER — CEPHALEXIN 500 MG PO CAPS: 500.0000 mg | ORAL_CAPSULE | Freq: Three times a day (TID) | ORAL | 0 refills | Status: AC

## 2023-12-13 NOTE — ED Provider Notes (Signed)
 [facility]  Note:  This document was prepared using Conservation officer, historic buildings and may include unintentional dictation errors.  MRN: [id number] DOB: [birth year]  Subjective:   Cristina Sanchez is a 63 y.o. female presenting for wound check following a fracture of the toe. Has developed redness, drainage in between her toes of the right foot.  Had a foul smell last night with tenderness.  Has done soaks trying to keep her feet clean.  Has not followed up with podiatry as she was supposed to these next couple of weeks.  No current facility-administered medications for this encounter.  Current Outpatient Medications:    acetaminophen  (TYLENOL ) 500 MG tablet, Take 500 mg by mouth every 6 (six) hours as needed., Disp: , Rfl:    ASHWAGANDHA PO, Take 1 tablet by mouth daily., Disp: , Rfl:    b complex vitamins capsule, Take 1 capsule by mouth daily., Disp: , Rfl:    Boswellia Serrata (BOSWELLIA PO), Take by mouth., Disp: , Rfl:    Cholecalciferol (CVS VITAMIN D3 DROPS/INFANT PO), Take 4,000 Units by mouth. Twice weekly, Disp: , Rfl:    Coenzyme Q10 (COQ-10 PO), Take by mouth., Disp: , Rfl:    Cyanocobalamin  (VITAMIN B-12 PO), Take 2,000 mg by mouth., Disp: , Rfl:    cyclobenzaprine  (FLEXERIL ) 10 MG tablet, Take 1 tablet (10 mg total) by mouth 2 (two) times daily. Muscle spasms, Disp: 180 tablet, Rfl: 3   Ginger, Zingiber officinalis, (GINGER PO), Take by mouth., Disp: , Rfl:    Ginkgo Biloba (GNP GINGKO BILOBA EXTRACT PO), Take 1 tablet by mouth daily., Disp: , Rfl:    Magnesium 100 MG CAPS, Take 600 mg by mouth., Disp: , Rfl:    Melatonin 1 MG TABS, Take 3 mg by mouth. , Disp: , Rfl:    MELATONIN-LEMON BALM PO, Take by mouth., Disp: , Rfl:    Menthol, Topical Analgesic, (BIOFREEZE EX), Apply topically. Biofreeze - up to four times daily., Disp: , Rfl:    Multiple Vitamins-Minerals (MULTIVITAMIN PO), Take by mouth daily., Disp: , Rfl:    NON FORMULARY, CBD and  delta 8 PRN, Disp: , Rfl:    NONFORMULARY OR COMPOUNDED ITEM, CBD GUMMY AS NEEDED, Disp: , Rfl:    ondansetron  (ZOFRAN ) 4 MG tablet, Take 1 tablet (4 mg total) by mouth every 8 (eight) hours as needed for nausea or vomiting., Disp: 20 tablet, Rfl: 6   OVER THE COUNTER MEDICATION, as needed. Delta 8, Disp: , Rfl:    Probiotic Product (PROBIOTIC DAILY PO), Take by mouth., Disp: , Rfl:    Riboflavin 400 MG TABS, riboflavin (vitamin B2) 400 mg tablet  Take by oral route., Disp: , Rfl:    rizatriptan  (MAXALT -MLT) 10 MG disintegrating tablet, PLACE 1 TABLET ON TONGUE AT ONSET OF MIGRAINE, MAY REPEAT ONCE IN 2 HOURS IF NEEDED, Disp: 10 tablet, Rfl: 11   Turmeric (QC TUMERIC COMPLEX PO), Take by mouth., Disp: , Rfl:    Allergies  Allergen Reactions   Nsaids    Sulfa Antibiotics Hives    dizzness    Past Medical History:  Diagnosis Date   Arthritis R hip   Bursitis of left shoulder    hx of   Cancer (HCC)    kidney cancer   Chest pain    Fibromyalgia    Gastric ulcer    hx of   GERD (gastroesophageal reflux disease)    Headache(784.0) migraines   Hearing loss  Right    Migraines    Rosacea    Tietze syndrome    Vitamin D deficiency      Past Surgical History:  Procedure Laterality Date   ABDOMINAL HYSTERECTOMY  2009   c section  1997   CYSTOCELE REPAIR  2007   D&c x2     DILATION AND CURETTAGE OF UTERUS  8011,8010   x2   Diliation and Coutar     ESOPHAGOGASTRODUODENOSCOPY  03/24/2012   Procedure: ESOPHAGOGASTRODUODENOSCOPY (EGD);  Surgeon: Toribio SHAUNNA Cedar, MD;  Location: [facility];  Service: Endoscopy;  Laterality: N/A;   RECTOCELE REPAIR  2007   ROBOT ASSISTED LAPAROSCOPIC NEPHRECTOMY Left 11/26/2012   Procedure: ROBOTIC ASSISTED LAPAROSCOPIC NEPHRECTOMY;  Surgeon: Ricardo Likens, MD;  Location: [facility];  Service: Urology;  Laterality: Left;    Family History  Problem Relation Age of Onset   Dementia Mother    Heart attack Father    Kidney cancer Brother     Cervical cancer Sister     Social History   Tobacco Use   Smoking status: Never   Smokeless tobacco: Never  Vaping Use   Vaping status: Never Used  Substance Use Topics   Alcohol use: Yes    Comment: once or twice a year    Drug use: No    ROS   Objective:   Vitals: BP (!) 147/94 (BP Location: Left Arm)   Pulse 96   Temp (!) 97.5 F (36.4 C) (Oral)   Resp 16   SpO2 97%   Physical Exam Constitutional:      General: She is not in acute distress.    Appearance: Normal appearance. She is well-developed. She is not ill-appearing, toxic-appearing or diaphoretic.  HENT:     Head: Normocephalic and atraumatic.     Nose: Nose normal.     Mouth/Throat:     Mouth: Mucous membranes are moist.  Eyes:     General: No scleral icterus.       Right eye: No discharge.        Left eye: No discharge.     Extraocular Movements: Extraocular movements intact.  Cardiovascular:     Rate and Rhythm: Normal rate.  Pulmonary:     Effort: Pulmonary effort is normal.  Musculoskeletal:       Legs:  Skin:    General: Skin is warm and dry.  Neurological:     General: No focal deficit present.     Mental Status: She is alert and oriented to person, place, and time.  Psychiatric:        Mood and Affect: Mood normal.        Behavior: Behavior normal.             Dressing applied to the 3rd and 4th toes.  Assessment and Plan :   PDMP not reviewed this encounter.  1. Cellulitis of fourth toe of right foot   2. Right foot pain   3. Cellulitis of third toe of right foot    Will cover for secondary cellulitis with cephalexin .  Continue wound care at home.  Follow-up with podiatry soon as possible.   Christopher Savannah, PA-C 12/13/23 1051

## 2023-12-13 NOTE — ED Triage Notes (Signed)
 Pt reports, she broke the 4th right toe on 11/13/23. States the toe has a open wound and smell putrid. Pt was seeing by Podiatrist on 11/16/23 and was told by the ned of July her toe will be better.

## 2024-02-01 NOTE — Patient Instructions (Signed)
 Below is our plan:  We will continue rizatriptan  and gabapentin  as needed.   Please make sure you are staying well hydrated. I recommend 50-60 ounces daily. Well balanced diet and regular exercise encouraged. Consistent sleep schedule with 6-8 hours recommended.   Please continue follow up with care team as directed.   Follow up with me in 06/2024  You may receive a survey regarding today's visit. I encourage you to leave honest feed back as I do use this information to improve patient care. Thank you for seeing me today!   GENERAL HEADACHE INFORMATION:   Natural supplements: Magnesium Oxide or Magnesium Glycinate 500 mg at bed (up to 800 mg daily) Coenzyme Q10 300 mg in AM Vitamin B2- 200 mg twice a day   Add 1 supplement at a time since even natural supplements can have undesirable side effects. You can sometimes buy supplements cheaper (especially Coenzyme Q10) at www.WebmailGuide.co.za or at [facility].  Migraine with aura: There is increased risk for stroke in women with migraine with aura and a contraindication for the combined contraceptive pill for use by women who have migraine with aura. The risk for women with migraine without aura is lower. However other risk factors like smoking are far more likely to increase stroke risk than migraine. There is a recommendation for no smoking and for the use of OCPs without estrogen such as progestogen only pills particularly for women with migraine with aura.SABRA People who have migraine headaches with auras may be 3 times more likely to have a stroke caused by a blood clot, compared to migraine patients who don't see auras. Women who take hormone-replacement therapy may be 30 percent more likely to suffer a clot-based stroke than women not taking medication containing estrogen. Other risk factors like smoking and high blood pressure may be  much more important.    Vitamins and herbs that show potential:   Magnesium: Magnesium (250 mg twice a day or 500 mg  at bed) has a relaxant effect on smooth muscles such as blood vessels. Individuals suffering from frequent or daily headache usually have low magnesium levels which can be increase with daily supplementation of 400-750 mg. Three trials found 40-90% average headache reduction  when used as a preventative. Magnesium may help with headaches are aura, the best evidence for magnesium is for migraine with aura is its thought to stop the cortical spreading depression we believe is the pathophysiology of migraine aura.Magnesium also demonstrated the benefit in menstrually related migraine.  Magnesium is part of the messenger system in the serotonin cascade and it is a good muscle relaxant.  It is also useful for constipation which can be a side effect of other medications used to treat migraine. Good sources include nuts, whole grains, and tomatoes. Side Effects: loose stool/diarrhea  Riboflavin (vitamin B 2) 200 mg twice a day. This vitamin assists nerve cells in the production of ATP a principal energy storing molecule.  It is necessary for many chemical reactions in the body.  There have been at least 3 clinical trials of riboflavin using 400 mg per day all of which suggested that migraine frequency can be decreased.  All 3 trials showed significant improvement in over half of migraine sufferers.  The supplement is found in bread, cereal, milk, meat, and poultry.  Most Americans get more riboflavin than the recommended daily allowance, however riboflavin deficiency is not necessary for the supplements to help prevent headache. Side effects: energizing, green urine   Coenzyme Q10: This is  present in almost all cells in the body and is critical component for the conversion of energy.  Recent studies have shown that a nutritional supplement of CoQ10 can reduce the frequency of migraine attacks by improving the energy production of cells as with riboflavin.  Doses of 150 mg twice a day have been shown to be effective.    Melatonin: Increasing evidence shows correlation between melatonin secretion and headache conditions.  Melatonin supplementation has decreased headache intensity and duration.  It is widely used as a sleep aid.  Sleep is natures way of dealing with migraine.  A dose of 3 mg is recommended to start for headaches including cluster headache. Higher doses up to 15 mg has been reviewed for use in Cluster headache and have been used. The rationale behind using melatonin for cluster is that many theories regarding the cause of Cluster headache center around the disruption of the normal circadian rhythm in the brain.  This helps restore the normal circadian rhythm.   HEADACHE DIET: Foods and beverages which may trigger migraine Note that only 20% of headache patients are food sensitive. You will know if you are food sensitive if you get a headache consistently 20 minutes to 2 hours after eating a certain food. Only cut out a food if it causes headaches, otherwise you might remove foods you enjoy! What matters most for diet is to eat a well balanced healthy diet full of vegetables and low fat protein, and to not miss meals.   Chocolate, other sweets ALL cheeses except cottage and cream cheese Dairy products, yogurt, sour cream, ice cream Liver Meat extracts (Bovril, Marmite, meat tenderizers) Meats or fish which have undergone aging, fermenting, pickling or smoking. These include: Hotdogs,salami,Lox,sausage, mortadellas,smoked salmon, pepperoni, Pickled herring Pods of broad bean (English beans, Chinese pea pods, [place] (fava) beans, lima and navy beans Ripe avocado, ripe banana Yeast extracts or active yeast preparations such as Brewer's or Fleishman's (commercial bakes goods are permitted) Tomato based foods, pizza (lasagna, etc.)   MSG (monosodium glutamate) is disguised as many things; look for these common aliases: Monopotassium glutamate Autolysed yeast Hydrolysed protein Sodium  caseinate "flavorings" "all natural preservatives Nutrasweet   Avoid all other foods that convincingly provoke headaches.   Resources: The Dizzy Bluford Aid Your Headache Diet, migrainestrong.com  https://zamora-andrews.com/   Caffeine and Migraine For patients that have migraine, caffeine intake more than 3 days per week can lead to dependency and increased migraine frequency. I would recommend cutting back on your caffeine intake as best you can. The recommended amount of caffeine is 200-300 mg daily, although migraine patients may experience dependency at even lower doses. While you may notice an increase in headache temporarily, cutting back will be helpful for headaches in the long run. For more information on caffeine and migraine, visit: https://americanmigrainefoundation.org/resource-library/caffeine-and-migraine/   Headache Prevention Strategies:   1. Maintain a headache diary; learn to identify and avoid triggers.  - This can be a simple note where you log when you had a headache, associated symptoms, and medications used - There are several smartphone apps developed to help track migraines: Migraine Buddy, Migraine Monitor, Curelator N1-Headache App   Common triggers include: Emotional triggers: Emotional/Upset family or friends Emotional/Upset occupation Business reversal/success Anticipation anxiety Crisis-serious Post-crisis periodNew job/position   Physical triggers: Vacation Day Weekend Strenuous Exercise High Altitude Location New Move Menstrual Day Physical Illness Oversleep/Not enough sleep Weather changes Light: Photophobia or light sesnitivity treatment involves a balance between desensitization and reduction in overly strong input.  Use dark polarized glasses outside, but not inside. Avoid bright or fluorescent light, but do not dim environment to the point that going into a normally lit room hurts. Consider  FL-41 tint lenses, which reduce the most irritating wavelengths without blocking too much light.  These can be obtained at axonoptics.com or theraspecs.com Foods: see list above.   2. Limit use of acute treatments (over-the-counter medications, triptans, etc.) to no more than 2 days per week or 10 days per month to prevent medication overuse headache (rebound headache).     3. Follow a regular schedule (including weekends and holidays): Don't skip meals. Eat a balanced diet. 8 hours of sleep nightly. Minimize stress. Exercise 30 minutes per day. Being overweight is associated with a 5 times increased risk of chronic migraine. Keep well hydrated and drink 6-8 glasses of water  per day.   4. Initiate non-pharmacologic measures at the earliest onset of your headache. Rest and quiet environment. Relax and reduce stress. Breathe2Relax is a free app that can instruct you on    some simple relaxtion and breathing techniques. Http://Dawnbuse.com is a    free website that provides teaching videos on relaxation.  Also, there are  many apps that   can be downloaded for "mindful" relaxation.  An app called YOGA NIDRA will help walk you through mindfulness. Another app called Calm can be downloaded to give you a structured mindfulness guide with daily reminders and skill development. Headspace for guided meditation Mindfulness Based Stress Reduction Online Course: www.palousemindfulness.com Cold compresses.   5. Don't wait!! Take the maximum allowable dosage of prescribed medication at the first sign of migraine.   6. Compliance:  Take prescribed medication regularly as directed and at the first sign of a migraine.   7. Communicate:  Call your physician when problems arise, especially if your headaches change, increase in frequency/severity, or become associated with neurological symptoms (weakness, numbness, slurred speech, etc.). Proceed to emergency room if you experience new or worsening symptoms or  symptoms do not resolve, if you have new neurologic symptoms or if headache is severe, or for any concerning symptom.   8. Headache/pain management therapies: Consider various complementary methods, including medication, behavioral therapy, psychological counselling, biofeedback, massage therapy, acupuncture, dry needling, and other modalities.  Such measures may reduce the need for medications. Counseling for pain management, where patients learn to function and ignore/minimize their pain, seems to work very well.   9. Recommend changing family's attention and focus away from patient's headaches. Instead, emphasize daily activities. If first question of day is 'How are your headaches/Do you have a headache today?', then patient will constantly think about headaches, thus making them worse. Goal is to re-direct attention away from headaches, toward daily activities and other distractions.   10. Helpful Websites: www.AmericanHeadacheSociety.org PatentHood.ch www.headaches.org TightMarket.nl www.achenet.org   Management of Memory Problems   There are some general things you can do to help manage your memory problems.  Your memory may not in fact recover, but by using techniques and strategies you will be able to manage your memory difficulties better.   1)  Establish a routine. Try to establish and then stick to a regular routine.  By doing this, you will get used to what to expect and you will reduce the need to rely on your memory.  Also, try to do things at the same time of day, such as taking your medication or checking your calendar first thing in the morning. Think about think that you can do  as a part of a regular routine and make a list.  Then enter them into a daily planner to remind you.  This will help you establish a routine.   2)  Organize your environment. Organize your environment so that it is uncluttered.  Decrease visual stimulation.  Place everyday items such as  keys or cell phone in the same place every day (ie.  Basket next to front door) Use post it notes with a brief message to yourself (ie. Turn off light, lock the door) Use labels to indicate where things go (ie. Which cupboards are for food, dishes, etc.) Keep a notepad and pen by the telephone to take messages   3)  Memory Aids A diary or journal/notebook/daily planner Making a list (shopping list, chore list, to do list that needs to be done) Using an alarm as a reminder (kitchen timer or cell phone alarm) Using cell phone to store information (Notes, Calendar, Reminders) Calendar/White board placed in a prominent position Post-it notes   In order for memory aids to be useful, you need to have good habits.  It's no good remembering to make a note in your journal if you don't remember to look in it.  Try setting aside a certain time of day to look in journal.   4)  Improving mood and managing fatigue. There may be other factors that contribute to memory difficulties.  Factors, such as anxiety, depression and tiredness can affect memory. Regular gentle exercise can help improve your mood and give you more energy. Exercise: there are short videos created by the [employer] on Health specially for older adults: https://bit.ly/2I30q97.  Mediterranean diet: which emphasizes fruits, vegetables, whole grains, legumes, fish, and other seafood; unsaturated fats such as olive oils; and low amounts of red meat, eggs, and sweets. A variation of this, called MIND (Mediterranean-DASH Intervention for Neurodegenerative Delay) incorporates the DASH (Dietary Approaches to Stop Hypertension) diet, which has been shown to lower high blood pressure, a risk factor for Alzheimer's disease. More information at: ExitMarketing.de.  Aerobic exercise that improve heart health is also good for the mind.  [employer] on Aging have short  videos for exercises that you can do at home: BlindWorkshop.com.pt Simple relaxation techniques may help relieve symptoms of anxiety Try to get back to completing activities or hobbies you enjoyed doing in the past. Learn to pace yourself through activities to decrease fatigue. Find out about some local support groups where you can share experiences with others. Try and achieve 7-8 hours of sleep at night.   Tasks to improve attention/working memory 1. Good sleep hygiene (7-8 hrs of sleep) 2. Learning a new skill (Painting, Carpentry, Pottery, new language, Knitting). 3.Cognitive exercises (keep a daily journal, Puzzles) 4. Physical exercise and training  (30 min/day X 4 days week) 5. Being on Antidepressant if needed 6.Yoga, Meditation, Tai Chi 7. Decrease alcohol intake 8.Have a clear schedule and structure in daily routine   MIND Diet: The Mediterranean-DASH Diet Intervention for Neurodegenerative Delay, or MIND diet, targets the health of the aging brain. Research participants with the highest MIND diet scores had a significantly slower rate of cognitive decline compared with those with the lowest scores. The effects of the MIND diet on cognition showed greater effects than either the Mediterranean or the DASH diet alone.   The healthy items the MIND diet guidelines suggest include:   3+ servings a day of whole grains 1+ servings a day of vegetables (other than green leafy) 6+ servings  a week of green leafy vegetables 5+ servings a week of nuts 4+ meals a week of beans 2+ servings a week of berries 2+ meals a week of poultry 1+ meals a week of fish Mainly olive oil if added fat is used   The unhealthy items, which are higher in saturated and trans fat, include: Less than 5 servings a week of pastries and sweets Less than 4 servings a week of red meat (including beef, pork, lamb, and products made from these meats) Less than one serving a week of cheese and fried foods Less  than 1 tablespoon a day of butter/stick margarine

## 2024-02-01 NOTE — Progress Notes (Unsigned)
 No chief complaint on file.    HISTORY OF PRESENT ILLNESS:  02/02/2024 ALL: Cristina Sanchez returns for follow up for memory loss and headaches. She was last seen by me 01/2023 and reported headaches were stable on rizatriptan . Memory had remained stable. Since,   02/02/2023 ALL: Cristina Sanchez returns for follow up for memory loss and headaches. She was last seen by Dr Onita 07/2022. MoCA 28/30. MRI was unremarkable. She was started on venlafaxine  37.5 and Zomig  5mg  PRN. Since, she reports headaches have been fairly stable. She did not start venlafaxine . She did try Zomig  but was concerned that it may have caused some chest pain. She feels headaches have been better over the past few months. She continues to focus on healthy diet and takes supplements. She is followed by PCP, regularly. Dr Loretha retired. She continues to have times where she worries that she can't remember things as easily as she could in the past. Occasional dizziness but not as severe. She has been participating in [facility] therapy (online PT) since 08/2022 and feels it is helping.    07/31/2022 YY: Cristina Sanchez, is here for follow-up constellation of complaints, chronic migraine, her primary care physician is Dr.   Loretha, [place]    I reviewed and summarized the referring note. PMHX. Anxiety Chronic migraine Fibromyalgia   Cristina Sanchez has been followed by our clinic for many years for chronic migraine headaches, over the years, she was noted to have significant anxiety, also complains of fibromyalgia   She is tearful at today's clinical visit, complains of very stressful few years since 2019, lost multiple family members,     With all the stress, she complains of increased anxiety, frequent dizziness, almost daily basis, described as Ajovy headache, sometimes difficulty focusing, mild degree of daily headaches couple times each week it would exacerbated to almost severe pounding headache, require Maxalt , helps sometimes, but also she has to go  to bed, suffer more prolonged even days of headache   Mother suffered vascular dementia died at age 80, she noticed word finding difficulties, mild short-term memory loss, she did not finish her college, 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 We restarted gabapentin  at last follow up for migraine prevention. She feels that it may be helping some. She has noticed most benefit with decreased skin pain. She was seen by rheumatology and autoimmune work up negative. She was encouraged to continue healthy lifestyle habits. She denies severe headaches. She does have regular mild headaches that do not disrupt her day to day activities. Rizatriptan  helps some. She feels that rest and supplemental treatment with CBD/delta 8 helps more than anything. She does describe feeling of broken glass in her eyes and occasional blurred vision. PCP advised ophthalmology follow up for dry eyes.   05/08/2020 ALL: Cristina Sanchez is a 63 y.o. female here today for follow up for migraines. She has continues rizatriptan  and cyclobenzaprine  for abortive therapy. She may be getting a few more headaches than usual. Has not tolerated nortriptyline . She was previously taking gabapentin  for fibromyalgia management (can also help with migraine prevention) but reports that she weaned herself off of this medication. She reports that she hasn't taken it in about 3 years. She felt that it helped significantly with skin pain. She is more tired than usual, especially after eating carbs. She eats a vegan diet. She [place]  who was managing fibromyalgia pain. She is now followed by PCP. She uses CBD oil prn that may help a little. She is doing water  aerobics about three times a week.   11/02/2019 ALL: Cristina Sanchez is a 63 y.o. female here today for follow up for headaches. She took nortriptyline  10mg  for about a week and  felt that constipation was much worse. She weaned medication. She tried taking it again around the first of the year and constipation returned. She feels that overall, she is doing ok. Headaches are fairly well managed. She has used CBD and feels that it helps more than anything else she has tried. She is feeling well today and without complaints.    HISTORY: (copied from Dr Georgianne note on 04/07/2019)   Cristina Sanchez has been followed up with our clinic for her chronic migraine with aura, also has history of fibromyalgia.   She continue have frequent dizziness spells,  also typical migraine headaches, about once or twice each week, she describes sudden onset transient vertigo sensation, with tinnitus, sometimes associated with her lateralized migraine headaches, light noise sensitivity, nauseous, can lasting for hours to days, sometimes her migraine is proceeded with visual auras,   Her sister, all 3 of her children has typical migraine headaches, sometimes with visual auras   She went through a lot of stress at the end of 2019, lost few family members   UPDATE Apr 07 2019: She continues to have frequent headaches, migraines twice a week, lateralized severe pounding headache with light noise sensitivity, Maxalt  works well, takes away her headache in 30 to 60 minutes, she has been taking magnesium oxide and riboflavin, did not try Topamax  as prescribed, worried about side effect   She complains a lot of stress,    REVIEW OF SYSTEMS: Out of a complete 14 system review of symptoms, the patient complains only of the following symptoms, headaches, fatigue, pain and all other reviewed systems are negative.   ALLERGIES: Allergies  Allergen Reactions   Nsaids    Sulfa Antibiotics Hives    dizzness     HOME MEDICATIONS: Outpatient Medications Prior to Visit  Medication Sig Dispense Refill   acetaminophen  (TYLENOL ) 500 MG tablet Take 500 mg by mouth every 6 (six) hours as needed.     ASHWAGANDHA PO  Take 1 tablet by mouth daily.     b complex vitamins capsule Take 1 capsule by mouth daily.     Boswellia Serrata (BOSWELLIA PO) Take by mouth.     cephALEXin  (KEFLEX ) 500 MG capsule Take 1 capsule (500 mg total) by mouth 3 (three) times daily. 21 capsule 0   Cholecalciferol ([employer] VITAMIN D3 DROPS/INFANT PO) Take 4,000 Units by mouth. Twice weekly     Coenzyme Q10 (COQ-10 PO) Take by mouth.     Cyanocobalamin  (VITAMIN B-12 PO) Take 2,000 mg by mouth.     cyclobenzaprine  (FLEXERIL ) 10 MG tablet Take 1 tablet (10 mg total) by mouth 2 (two) times daily. Muscle spasms 180 tablet 3   Ginger, Zingiber officinalis, (GINGER PO) Take by mouth.     Ginkgo Biloba (GNP GINGKO BILOBA EXTRACT PO) Take 1 tablet by mouth daily.     Magnesium 100 MG CAPS Take 600 mg by mouth.     Melatonin 1 MG TABS Take 3 mg by mouth.      MELATONIN-LEMON BALM PO Take by mouth.     Menthol, Topical Analgesic, (BIOFREEZE EX) Apply topically. Biofreeze - up to four times daily.  Multiple Vitamins-Minerals (MULTIVITAMIN PO) Take by mouth daily.     NON FORMULARY CBD and delta 8 PRN     NONFORMULARY OR COMPOUNDED ITEM CBD GUMMY AS NEEDED     ondansetron  (ZOFRAN ) 4 MG tablet Take 1 tablet (4 mg total) by mouth every 8 (eight) hours as needed for nausea or vomiting. 20 tablet 6   OVER THE COUNTER MEDICATION as needed. Delta 8     Probiotic Product (PROBIOTIC DAILY PO) Take by mouth.     Riboflavin 400 MG TABS riboflavin (vitamin B2) 400 mg tablet  Take by oral route.     rizatriptan  (MAXALT -MLT) 10 MG disintegrating tablet PLACE 1 TABLET ON TONGUE AT ONSET OF MIGRAINE, MAY REPEAT ONCE IN 2 HOURS IF NEEDED 10 tablet 11   Turmeric (QC TUMERIC COMPLEX PO) Take by mouth.     No facility-administered medications prior to visit.     PAST MEDICAL HISTORY: Past Medical History:  Diagnosis Date   Arthritis R hip   Bursitis of left shoulder    hx of   Cancer (HCC)    kidney cancer   Chest pain    Fibromyalgia    Gastric  ulcer    hx of   GERD (gastroesophageal reflux disease)    Headache(784.0) migraines   Hearing loss    Right    Migraines    Rosacea    Tietze syndrome    Vitamin D deficiency      PAST SURGICAL HISTORY: Past Surgical History:  Procedure Laterality Date   ABDOMINAL HYSTERECTOMY  2009   c section  1997   CYSTOCELE REPAIR  2007   D&c x2     DILATION AND CURETTAGE OF UTERUS  8011,8010   x2   Diliation and Coutar     ESOPHAGOGASTRODUODENOSCOPY  03/24/2012   Procedure: ESOPHAGOGASTRODUODENOSCOPY (EGD);  Surgeon: Toribio SHAUNNA Cedar, MD;  Location: [facility];  Service: Endoscopy;  Laterality: N/A;   RECTOCELE REPAIR  2007   ROBOT ASSISTED LAPAROSCOPIC NEPHRECTOMY Left 11/26/2012   Procedure: ROBOTIC ASSISTED LAPAROSCOPIC NEPHRECTOMY;  Surgeon: Ricardo Likens, MD;  Location: [facility];  Service: Urology;  Laterality: Left;     FAMILY HISTORY: Family History  Problem Relation Age of Onset   Dementia Mother    Heart attack Father    Kidney cancer Brother    Cervical cancer Sister      SOCIAL HISTORY: Social History   Socioeconomic History   Marital status: Married    Spouse name: martin   Number of children: 3   Years of education: college   Highest education level: Not on file  Occupational History    Comment: Home maker  Tobacco Use   Smoking status: Never   Smokeless tobacco: Never  Vaping Use   Vaping status: Never Used  Substance and Sexual Activity   Alcohol use: Yes    Comment: once or twice a year    Drug use: No   Sexual activity: Yes  Other Topics Concern   Not on file  Social History Narrative   Patient lives at home with her husband.    Patient has 3 children.    Patient is currently a homemaker.    Patient is right handed.          Social Drivers of Corporate investment banker Strain: Not on file  Food Insecurity: Not on file  Transportation Needs: Not on file  Physical Activity: Not on file  Stress: Not on file  Social Connections: Not  on  file  Intimate Partner Violence: Not on file      PHYSICAL EXAM  There were no vitals filed for this visit.    There is no height or weight on file to calculate BMI.   Generalized: Well developed, in no acute distress  Cardiology: normal rate and rhythm, no murmur auscultated  Respiratory: clear to auscultation bilaterally    Neurological examination  Mentation: Alert oriented to time, place, history taking. Follows all commands speech and language fluent Cranial nerve II-XII: Pupils were equal round reactive to light. Extraocular movements were full, visual field were full on confrontational test. Facial sensation and strength were normal. Head turning and shoulder shrug were normal and symmetric. Motor: The motor testing reveals 5 over 5 strength of all 4 extremities. Good symmetric motor tone is noted throughout.  Gait and station: Gait is normal.     DIAGNOSTIC DATA (LABS, IMAGING, TESTING) - I reviewed patient records, labs, notes, testing and imaging myself where available.  Lab Results  Component Value Date   WBC 5.0 11/22/2012   HGB 10.5 (L) 11/27/2012   HCT 30.7 (L) 11/27/2012   MCV 91.6 11/22/2012   PLT 330 11/22/2012      Component Value Date/Time   NA 133 (L) 11/29/2012 0430   K 4.3 11/29/2012 0430   CL 99 11/29/2012 0430   CO2 30 11/29/2012 0430   GLUCOSE 109 (H) 11/29/2012 0430   BUN 8 11/29/2012 0430   CREATININE 0.80 08/03/2019 0933   CALCIUM 9.1 11/29/2012 0430   PROT 6.2 03/23/2012 2010   ALBUMIN 3.4 (L) 03/23/2012 2010   AST 14 03/23/2012 2010   ALT 12 03/23/2012 2010   ALKPHOS 44 03/23/2012 2010   BILITOT 0.3 03/23/2012 2010   GFRNONAA 62 (L) 11/29/2012 0430   GFRAA 72 (L) 11/29/2012 0430   No results found for: CHOL, HDL, LDLCALC, LDLDIRECT, TRIG, CHOLHDL No results found for: YHAJ8R No results found for: VITAMINB12 Lab Results  Component Value Date   TSH 2.95 08/01/2020      07/31/2022   11:00 AM  [facility]  Cognitive Assessment   Visuospatial/ Executive (0/5) 5  Naming (0/3) 3  Attention: Read list of digits (0/2) 1  Attention: Read list of letters (0/1) 1  Attention: Serial 7 subtraction starting at 100 (0/3) 3  Language: Repeat phrase (0/2) 2  Language : Fluency (0/1) 0  Abstraction (0/2) 2  Delayed Recall (0/5) 5  Orientation (0/6) 6  Total 28      ASSESSMENT AND PLAN  63 y.o. year old female  has a past medical history of Arthritis (R hip), Bursitis of left shoulder, Cancer (HCC), Chest pain, Fibromyalgia, Gastric ulcer, GERD (gastroesophageal reflux disease), Headache(784.0) (migraines), Hearing loss, Migraines, Rosacea, Tietze syndrome, and Vitamin D deficiency. here with   No diagnosis found.  Glynnis feels that headache severity and frequency is better over the past few months. She prefers to continue rizatriptan  as needed. MRI unremarkable. MoCA 28/30 at last visit. She could consider formal neurocognitive testing if she wishes. I do not suspect neurodegenerative memory loss at this time. Healthy lifestyle habits advised. I have encouraged her to follow-up closely with her primary care provider for management of fibromyalgia should skin pain or fatigue worsen.  Healthy lifestyle habits encouraged.  She will follow-up in 1 year, sooner if needed.  She verbalizes understanding and agreement with this plan.   I spent 30 minutes of face-to-face and non-face-to-face time with patient.  This included previsit chart review,  lab review, study review, order entry, electronic health record documentation, patient education.    Greig Forbes, MSN, FNP-C 02/01/2024, 7:55 AM  [facility] [address], [address] [place], [place] [address] [phone]

## 2024-02-02 ENCOUNTER — Ambulatory Visit: Payer: BC Managed Care – PPO | Admitting: Family Medicine

## 2024-02-02 ENCOUNTER — Encounter: Payer: Self-pay | Admitting: Family Medicine

## 2024-02-02 VITALS — BP 118/78 | HR 72 | Ht 66.0 in | Wt 141.0 lb

## 2024-02-02 DIAGNOSIS — H04123 Dry eye syndrome of bilateral lacrimal glands: Secondary | ICD-10-CM | POA: Diagnosis not present

## 2024-02-02 DIAGNOSIS — R413 Other amnesia: Secondary | ICD-10-CM | POA: Diagnosis not present

## 2024-02-02 DIAGNOSIS — G43109 Migraine with aura, not intractable, without status migrainosus: Secondary | ICD-10-CM | POA: Diagnosis not present

## 2024-02-02 MED ORDER — RIZATRIPTAN BENZOATE 10 MG PO TBDP: ORAL_TABLET | ORAL | 11 refills | Status: DC

## 2024-02-02 MED ORDER — GABAPENTIN 300 MG PO CAPS: 300.0000 mg | ORAL_CAPSULE | Freq: Every day | ORAL | 1 refills | Status: AC | PRN

## 2024-02-18 DIAGNOSIS — Z7184 Encounter for health counseling related to travel: Secondary | ICD-10-CM | POA: Diagnosis not present

## 2024-02-18 DIAGNOSIS — G43909 Migraine, unspecified, not intractable, without status migrainosus: Secondary | ICD-10-CM | POA: Diagnosis not present

## 2024-03-18 ENCOUNTER — Encounter: Payer: Self-pay | Admitting: Family Medicine

## 2024-06-22 ENCOUNTER — Ambulatory Visit: Admitting: Family Medicine

## 2024-07-12 ENCOUNTER — Ambulatory Visit: Admitting: Family Medicine

## 2024-07-12 ENCOUNTER — Encounter: Payer: Self-pay | Admitting: Family Medicine

## 2024-07-12 VITALS — BP 112/75 | HR 82 | Ht 66.0 in | Wt 144.2 lb

## 2024-07-12 DIAGNOSIS — G43109 Migraine with aura, not intractable, without status migrainosus: Secondary | ICD-10-CM | POA: Diagnosis not present

## 2024-07-12 MED ORDER — EMGALITY 120 MG/ML ~~LOC~~ SOAJ: SUBCUTANEOUS | 0 refills | Status: AC

## 2024-07-12 MED ORDER — RIZATRIPTAN BENZOATE 10 MG PO TBDP: ORAL_TABLET | ORAL | 11 refills | Status: AC

## 2024-07-12 MED ORDER — ONDANSETRON HCL 4 MG PO TABS: 4.0000 mg | ORAL_TABLET | Freq: Three times a day (TID) | ORAL | 6 refills | Status: AC | PRN

## 2024-07-12 NOTE — Patient Instructions (Signed)
 Below is our plan:  We will start Emgality  injections for prevention of migraines. Take two injections for the first loading dose. Continue 1 injection every 30 days if tolerated well. Continue rizatriptan  but try to limit dosing to no more than 10 tablets each month. Continue ondansetron  as needed.   Please make sure you are staying well hydrated. I recommend 50-60 ounces daily. Well balanced diet and regular exercise encouraged. Consistent sleep schedule with 6-8 hours recommended.   Please continue follow up with care team as directed.   Follow up with me in 6 months   You may receive a survey regarding today's visit. I encourage you to leave honest feed back as I do use this information to improve patient care. Thank you for seeing me today!   GENERAL HEADACHE INFORMATION:   Natural supplements: Magnesium Oxide or Magnesium Glycinate 500 mg at bed (up to 800 mg daily) Coenzyme Q10 300 mg in AM Vitamin B2- 200 mg twice a day   Add 1 supplement at a time since even natural supplements can have undesirable side effects. You can sometimes buy supplements cheaper (especially Coenzyme Q10) at www.webmailguide.co.za or at [facility].  Migraine with aura: There is increased risk for stroke in women with migraine with aura and a contraindication for the combined contraceptive pill for use by women who have migraine with aura. The risk for women with migraine without aura is lower. However other risk factors like smoking are far more likely to increase stroke risk than migraine. There is a recommendation for no smoking and for the use of OCPs without estrogen such as progestogen only pills particularly for women with migraine with aura.SABRA People who have migraine headaches with auras may be 3 times more likely to have a stroke caused by a blood clot, compared to migraine patients who don't see auras. Women who take hormone-replacement therapy may be 30 percent more likely to suffer a clot-based stroke than  women not taking medication containing estrogen. Other risk factors like smoking and high blood pressure may be  much more important.    Vitamins and herbs that show potential:   Magnesium: Magnesium (250 mg twice a day or 500 mg at bed) has a relaxant effect on smooth muscles such as blood vessels. Individuals suffering from frequent or daily headache usually have low magnesium levels which can be increase with daily supplementation of 400-750 mg. Three trials found 40-90% average headache reduction  when used as a preventative. Magnesium may help with headaches are aura, the best evidence for magnesium is for migraine with aura is its thought to stop the cortical spreading depression we believe is the pathophysiology of migraine aura.Magnesium also demonstrated the benefit in menstrually related migraine.  Magnesium is part of the messenger system in the serotonin cascade and it is a good muscle relaxant.  It is also useful for constipation which can be a side effect of other medications used to treat migraine. Good sources include nuts, whole grains, and tomatoes. Side Effects: loose stool/diarrhea  Riboflavin (vitamin B 2) 200 mg twice a day. This vitamin assists nerve cells in the production of ATP a principal energy storing molecule.  It is necessary for many chemical reactions in the body.  There have been at least 3 clinical trials of riboflavin using 400 mg per day all of which suggested that migraine frequency can be decreased.  All 3 trials showed significant improvement in over half of migraine sufferers.  The supplement is found in bread, cereal,  milk, meat, and poultry.  Most Americans get more riboflavin than the recommended daily allowance, however riboflavin deficiency is not necessary for the supplements to help prevent headache. Side effects: energizing, green urine   Coenzyme Q10: This is present in almost all cells in the body and is critical component for the conversion of energy.   Recent studies have shown that a nutritional supplement of CoQ10 can reduce the frequency of migraine attacks by improving the energy production of cells as with riboflavin.  Doses of 150 mg twice a day have been shown to be effective.   Melatonin: Increasing evidence shows correlation between melatonin secretion and headache conditions.  Melatonin supplementation has decreased headache intensity and duration.  It is widely used as a sleep aid.  Sleep is natures way of dealing with migraine.  A dose of 3 mg is recommended to start for headaches including cluster headache. Higher doses up to 15 mg has been reviewed for use in Cluster headache and have been used. The rationale behind using melatonin for cluster is that many theories regarding the cause of Cluster headache center around the disruption of the normal circadian rhythm in the brain.  This helps restore the normal circadian rhythm.   HEADACHE DIET: Foods and beverages which may trigger migraine Note that only 20% of headache patients are food sensitive. You will know if you are food sensitive if you get a headache consistently 20 minutes to 2 hours after eating a certain food. Only cut out a food if it causes headaches, otherwise you might remove foods you enjoy! What matters most for diet is to eat a well balanced healthy diet full of vegetables and low fat protein, and to not miss meals.   Chocolate, other sweets ALL cheeses except cottage and cream cheese Dairy products, yogurt, sour cream, ice cream Liver Meat extracts (Bovril, Marmite, meat tenderizers) Meats or fish which have undergone aging, fermenting, pickling or smoking. These include: Hotdogs,salami,Lox,sausage, mortadellas,smoked salmon, pepperoni, Pickled herring Pods of broad bean (English beans, Chinese pea pods, Italian (fava) beans, lima and navy beans Ripe avocado, ripe banana Yeast extracts or active yeast preparations such as Brewer's or Fleishman's (commercial bakes  goods are permitted) Tomato based foods, pizza (lasagna, etc.)   MSG (monosodium glutamate) is disguised as many things; look for these common aliases: Monopotassium glutamate Autolysed yeast Hydrolysed protein Sodium caseinate flavorings all natural preservatives Nutrasweet   Avoid all other foods that convincingly provoke headaches.   Resources: The Dizzy Bluford Aid Your Headache Diet, migrainestrong.com  https://zamora-andrews.com/   Caffeine and Migraine For patients that have migraine, caffeine intake more than 3 days per week can lead to dependency and increased migraine frequency. I would recommend cutting back on your caffeine intake as best you can. The recommended amount of caffeine is 200-300 mg daily, although migraine patients may experience dependency at even lower doses. While you may notice an increase in headache temporarily, cutting back will be helpful for headaches in the long run. For more information on caffeine and migraine, visit: https://americanmigrainefoundation.org/resource-library/caffeine-and-migraine/   Headache Prevention Strategies:   1. Maintain a headache diary; learn to identify and avoid triggers.  - This can be a simple note where you log when you had a headache, associated symptoms, and medications used - There are several smartphone apps developed to help track migraines: Migraine Buddy, Migraine Monitor, Curelator N1-Headache App   Common triggers include: Emotional triggers: Emotional/Upset family or friends Emotional/Upset occupation Business reversal/success Anticipation anxiety Crisis-serious Post-crisis periodNew job/position  Physical triggers: Vacation Day Weekend Strenuous Exercise High Altitude Location New Move Menstrual Day Physical Illness Oversleep/Not enough sleep Weather changes Light: Photophobia or light sesnitivity treatment involves a balance between  desensitization and reduction in overly strong input. Use dark polarized glasses outside, but not inside. Avoid bright or fluorescent light, but do not dim environment to the point that going into a normally lit room hurts. Consider FL-41 tint lenses, which reduce the most irritating wavelengths without blocking too much light.  These can be obtained at axonoptics.com or theraspecs.com Foods: see list above.   2. Limit use of acute treatments (over-the-counter medications, triptans, etc.) to no more than 2 days per week or 10 days per month to prevent medication overuse headache (rebound headache).     3. Follow a regular schedule (including weekends and holidays): Don't skip meals. Eat a balanced diet. 8 hours of sleep nightly. Minimize stress. Exercise 30 minutes per day. Being overweight is associated with a 5 times increased risk of chronic migraine. Keep well hydrated and drink 6-8 glasses of water  per day.   4. Initiate non-pharmacologic measures at the earliest onset of your headache. Rest and quiet environment. Relax and reduce stress. Breathe2Relax is a free app that can instruct you on    some simple relaxtion and breathing techniques. Http://Dawnbuse.com is a    free website that provides teaching videos on relaxation.  Also, there are  many apps that   can be downloaded for mindful relaxation.  An app called YOGA NIDRA will help walk you through mindfulness. Another app called Calm can be downloaded to give you a structured mindfulness guide with daily reminders and skill development. Headspace for guided meditation Mindfulness Based Stress Reduction Online Course: www.palousemindfulness.com Cold compresses.   5. Don't wait!! Take the maximum allowable dosage of prescribed medication at the first sign of migraine.   6. Compliance:  Take prescribed medication regularly as directed and at the first sign of a migraine.   7. Communicate:  Call your physician when problems arise,  especially if your headaches change, increase in frequency/severity, or become associated with neurological symptoms (weakness, numbness, slurred speech, etc.). Proceed to emergency room if you experience new or worsening symptoms or symptoms do not resolve, if you have new neurologic symptoms or if headache is severe, or for any concerning symptom.   8. Headache/pain management therapies: Consider various complementary methods, including medication, behavioral therapy, psychological counselling, biofeedback, massage therapy, acupuncture, dry needling, and other modalities.  Such measures may reduce the need for medications. Counseling for pain management, where patients learn to function and ignore/minimize their pain, seems to work very well.   9. Recommend changing family's attention and focus away from patient's headaches. Instead, emphasize daily activities. If first question of day is 'How are your headaches/Do you have a headache today?', then patient will constantly think about headaches, thus making them worse. Goal is to re-direct attention away from headaches, toward daily activities and other distractions.   10. Helpful Websites: www.AmericanHeadacheSociety.org patenthood.ch www.headaches.org tightmarket.nl www.achenet.org

## 2024-07-12 NOTE — Progress Notes (Signed)
 "   Chief Complaint  Patient presents with   Follow-up    Pt in room 1.alone. Here for migraine follow up. Needs refill on Zofran .      HISTORY OF PRESENT ILLNESS:  07/12/2024 ALL:  Cristina Sanchez returns for follow up for migraines. She was last seen 01/2024 and reported headaches were fairly stable. She reports now having to take 2-4 tablets of rizatriptan  twice a week. She feels she needs to take two doses of rizatriptan  to abort headache more often than not. She is trying to stay well hydrated. She eats well balanced diet and tries to get regular sleep. She continues gabapentin  as needed for dysesthesias which are stable.   Tried and failed: nortriptyline , venlafaxine , gabapentin , topiramate  contraindicated d/t memory loss, propranolol contraindicated d/t low BP, magnesium, CoQ10, B complex vitamin, rizatriptan    02/02/2024 ALL: Cristina Sanchez returns for follow up for memory loss and headaches. She was last seen by me 01/2023 and reported headaches were stable on rizatriptan . Memory had remained stable. She saw Dr Onita 11/2023. No changes made in care plan. Since, she reports doing fair. She had cataracts removed 06/2023. Headaches seemed to worsen significantly as she was have intractable photophobia. She feels symptoms have settled down over the past few months. She estimates about 2-3 migrainous headaches weekly. Some dizziness. She treats with rizatriptan  and Tylenol . Does not always abort. She continues to experience intermittent dysesthesias. Previously taking gabapentin  300mg  PRN and this helps. Requesting refill. She uses Biofreeze and CBD gummies PRN. She sleeps well, generally. Using melatonin, zinc and mag at bedtime. Eats regular meals. Drinking plenty of water .   11/26/2023: She complains of frequent almost daily headache at the beginning of 2025, retro-orbital area severe headache with light, noise, smell sensitivity, nauseous, lasting hours or days, Maxalt  as needed was helpful   She was evaluated by  ophthalmologist, was given fluorometholone eyedrop, which did help her headache,   Even now, at her better stage, she is having 2-3 migraine each week, we have discussed daily preventative medications in the past, she is hesitant to be started even low-dose nortriptyline , Effexor , worry about the side effect, previously was treated with gabapentin  600 mg 3 times daily, did help her fibromyalgia and migraines   She apparently still have some anxiety, anxious coming to the doctor's office, after discussion, we decided to try over-the-counter magnesium oxide 400 mg twice a day plus riboflavin 100 mg twice a day as preventive medications   Her sister suffers severe migraine, is receiving Botox, Qulipta daily as migraine prevention   MRI brain was normal in March 2024.  02/02/2023 ALL: Cristina Sanchez returns for follow up for memory loss and headaches. She was last seen by Dr Onita 07/2022. MoCA 28/30. MRI was unremarkable. She was started on venlafaxine  37.5 and Zomig  5mg  PRN. Since, she reports headaches have been fairly stable. She did not start venlafaxine . She did try Zomig  but was concerned that it may have caused some chest pain. She feels headaches have been better over the past few months. She continues to focus on healthy diet and takes supplements. She is followed by PCP, regularly. Dr Loretha retired. She continues to have times where she worries that she can't remember things as easily as she could in the past. Occasional dizziness but not as severe. She has been participating in [facility] therapy (online PT) since 08/2022 and feels it is helping.    07/31/2022 YY: Cristina Sanchez, is here for follow-up constellation of complaints, chronic migraine, her primary  care physician is Dr.   Loretha, [place]    I reviewed and summarized the referring note. PMHX. Anxiety Chronic migraine Fibromyalgia   Cristina Sanchez has been followed by our clinic for many years for chronic migraine headaches, over the years, she was  noted to have significant anxiety, also complains of fibromyalgia   She is tearful at today's clinical visit, complains of very stressful few years since 2019, lost multiple family members,     With all the stress, she complains of increased anxiety, frequent dizziness, almost daily basis, described as Ajovy headache, sometimes difficulty focusing, mild degree of daily headaches couple times each week it would exacerbated to almost severe pounding headache, require Maxalt , helps sometimes, but also she has to go to bed, suffer more prolonged even days of headache   Mother suffered vascular dementia died at age 17, she noticed word finding difficulties, mild short-term memory loss, she did not finish her college, 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 We restarted gabapentin  at last follow up for migraine prevention. She feels that it may be helping some. She has noticed most benefit with decreased skin pain. She was seen by rheumatology and autoimmune work up negative. She was encouraged to continue healthy lifestyle habits. She denies severe headaches. She does have regular mild headaches that do not disrupt her day to day activities. Rizatriptan  helps some. She feels that rest and supplemental treatment with CBD/delta 8 helps more than anything. She does describe feeling of broken glass in her eyes and occasional blurred vision. PCP advised ophthalmology follow up for dry eyes.   05/08/2020 ALL: Cristina Sanchez is a 64 y.o. female here today for follow up for migraines. She has continues rizatriptan  and cyclobenzaprine  for abortive therapy. She may be getting a few more headaches than usual. Has not tolerated nortriptyline . She was previously taking gabapentin  for fibromyalgia management (can also help with migraine prevention) but reports that she weaned herself off of this medication. She reports that she hasn't taken it  in about 3 years. She felt that it helped significantly with skin pain. She is more tired than usual, especially after eating carbs. She eats a vegan diet. She was [place] neurolog[place] who was managing fibromyalgia pain. She is now followed by PCP. She uses CBD oil prn that may help a little. She is doing water  aerobics about three times a week.   11/02/2019 ALL: ETTIE KRONTZ is a 64 y.o. female here today for follow up for headaches. She took nortriptyline  10mg  for about a week and felt that constipation was much worse. She weaned medication. She tried taking it again around the first of the year and constipation returned. She feels that overall, she is doing ok. Headaches are fairly well managed. She has used CBD and feels that it helps more than anything else she has tried. She is feeling well today and without complaints.    HISTORY: (copied from Dr Georgianne note on 04/07/2019)   Evolette has been followed up with our clinic for her chronic migraine with aura, also has history of fibromyalgia.   She continue have frequent dizziness spells,  also typical migraine headaches, about once or twice each week, she describes sudden onset transient vertigo sensation, with tinnitus, sometimes associated with her lateralized migraine headaches, light noise sensitivity, nauseous, can lasting for hours to days, sometimes her migraine is proceeded with  visual auras,   Her sister, all 3 of her children has typical migraine headaches, sometimes with visual auras   She went through a lot of stress at the end of 2019, lost few family members   UPDATE Apr 07 2019: She continues to have frequent headaches, migraines twice a week, lateralized severe pounding headache with light noise sensitivity, Maxalt  works well, takes away her headache in 30 to 60 minutes, she has been taking magnesium oxide and riboflavin, did not try Topamax  as prescribed, worried about side effect   She complains a lot of  stress,    REVIEW OF SYSTEMS: Out of a complete 14 system review of symptoms, the patient complains only of the following symptoms, headaches, fatigue, pain and all other reviewed systems are negative.   ALLERGIES: Allergies  Allergen Reactions   Nsaids    Sulfa Antibiotics Hives    dizzness     HOME MEDICATIONS: Outpatient Medications Prior to Visit  Medication Sig Dispense Refill   acetaminophen  (TYLENOL ) 500 MG tablet Take 500 mg by mouth every 6 (six) hours as needed.     ASHWAGANDHA PO Take 1 tablet by mouth daily.     b complex vitamins capsule Take 1 capsule by mouth daily.     Boswellia Serrata (BOSWELLIA PO) Take by mouth.     cephALEXin  (KEFLEX ) 500 MG capsule Take 1 capsule (500 mg total) by mouth 3 (three) times daily. 21 capsule 0   Cholecalciferol ([employer] VITAMIN D3 DROPS/INFANT PO) Take 4,000 Units by mouth. Twice weekly     Coenzyme Q10 (COQ-10 PO) Take by mouth.     Cyanocobalamin  (VITAMIN B-12 PO) Take 2,000 mg by mouth.     cyclobenzaprine  (FLEXERIL ) 10 MG tablet Take 1 tablet (10 mg total) by mouth 2 (two) times daily. Muscle spasms 180 tablet 3   gabapentin  (NEURONTIN ) 300 MG capsule Take 1 capsule (300 mg total) by mouth daily as needed (for migraine management). 90 capsule 1   Ginger, Zingiber officinalis, (GINGER PO) Take by mouth.     Ginkgo Biloba (GNP GINGKO BILOBA EXTRACT PO) Take 1 tablet by mouth daily.     Magnesium 100 MG CAPS Take 600 mg by mouth.     Melatonin 1 MG TABS Take 3 mg by mouth.      Menthol, Topical Analgesic, (BIOFREEZE EX) Apply topically. Biofreeze - up to four times daily.     Multiple Vitamins-Minerals (MULTIVITAMIN PO) Take by mouth daily.     NON FORMULARY CBD and delta 8 PRN     NONFORMULARY OR COMPOUNDED ITEM CBD GUMMY AS NEEDED     OVER THE COUNTER MEDICATION as needed. Delta 8     Probiotic Product (PROBIOTIC DAILY PO) Take by mouth.     Riboflavin 400 MG TABS riboflavin (vitamin B2) 400 mg tablet  Take by oral route.      Turmeric (QC TUMERIC COMPLEX PO) Take by mouth.     ondansetron  (ZOFRAN ) 4 MG tablet Take 1 tablet (4 mg total) by mouth every 8 (eight) hours as needed for nausea or vomiting. 20 tablet 6   rizatriptan  (MAXALT -MLT) 10 MG disintegrating tablet PLACE 1 TABLET ON TONGUE AT ONSET OF MIGRAINE, MAY REPEAT ONCE IN 2 HOURS IF NEEDED 10 tablet 11   MELATONIN-LEMON BALM PO Take by mouth. (Patient not taking: Reported on 07/12/2024)     No facility-administered medications prior to visit.     PAST MEDICAL HISTORY: Past Medical History:  Diagnosis Date   Arthritis R hip  Bursitis of left shoulder    hx of   Cancer (HCC)    kidney cancer   Chest pain    Fibromyalgia    Gastric ulcer    hx of   GERD (gastroesophageal reflux disease)    Headache(784.0) migraines   Hearing loss    Right    Migraines    Rosacea    Tietze syndrome    Vitamin D deficiency      PAST SURGICAL HISTORY: Past Surgical History:  Procedure Laterality Date   ABDOMINAL HYSTERECTOMY  2009   c section  1997   CYSTOCELE REPAIR  2007   D&c x2     DILATION AND CURETTAGE OF UTERUS  8011,8010   x2   Diliation and Coutar     ESOPHAGOGASTRODUODENOSCOPY  03/24/2012   Procedure: ESOPHAGOGASTRODUODENOSCOPY (EGD);  Surgeon: Toribio SHAUNNA Cedar, MD;  Location: [facility];  Service: Endoscopy;  Laterality: N/A;   RECTOCELE REPAIR  2007   ROBOT ASSISTED LAPAROSCOPIC NEPHRECTOMY Left 11/26/2012   Procedure: ROBOTIC ASSISTED LAPAROSCOPIC NEPHRECTOMY;  Surgeon: Ricardo Likens, MD;  Location: [facility];  Service: Urology;  Laterality: Left;     FAMILY HISTORY: Family History  Problem Relation Age of Onset   Dementia Mother    Heart attack Father    Kidney cancer Brother    Cervical cancer Sister      SOCIAL HISTORY: Social History   Socioeconomic History   Marital status: Married    Spouse name: martin   Number of children: 3   Years of education: college   Highest education level: Not on file  Occupational History     Comment: Home maker  Tobacco Use   Smoking status: Never   Smokeless tobacco: Never  Vaping Use   Vaping status: Never Used  Substance and Sexual Activity   Alcohol use: Yes    Comment: once or twice a year    Drug use: No   Sexual activity: Yes  Other Topics Concern   Not on file  Social History Narrative   Patient lives at home with her husband.    Patient has 3 children.    Patient is currently a homemaker.    Patient is right handed.          Social Drivers of Health   Tobacco Use: Low Risk (07/12/2024)   Patient History    Smoking Tobacco Use: Never    Smokeless Tobacco Use: Never    Passive Exposure: Not on file  Financial Resource Strain: Not on file  Food Insecurity: Not on file  Transportation Needs: Not on file  Physical Activity: Not on file  Stress: Not on file  Social Connections: Not on file  Intimate Partner Violence: Not on file  Depression (EYV7-0): Not on file  Alcohol Screen: Not on file  Housing: Not on file  Utilities: Not on file  Health Literacy: Not on file      PHYSICAL EXAM  Vitals:   07/12/24 0958  BP: 112/75  Pulse: 82  SpO2: 96%  Weight: 144 lb 3.2 oz (65.4 kg)  Height: 5' 6 (1.676 m)      Body mass index is 23.27 kg/m.   Generalized: Well developed, in no acute distress  Cardiology: normal rate and rhythm, no murmur auscultated  Respiratory: clear to auscultation bilaterally    Neurological examination  Mentation: Alert oriented to time, place, history taking. Follows all commands speech and language fluent Cranial nerve II-XII: Pupils were equal round reactive to light. Extraocular  movements were full, visual field were full on confrontational test. Facial sensation and strength were normal. Head turning and shoulder shrug were normal and symmetric. Motor: The motor testing reveals 5 over 5 strength of all 4 extremities. Good symmetric motor tone is noted throughout.  Gait and station: Gait is  normal.     DIAGNOSTIC DATA (LABS, IMAGING, TESTING) - I reviewed patient records, labs, notes, testing and imaging myself where available.  Lab Results  Component Value Date   WBC 5.0 11/22/2012   HGB 10.5 (L) 11/27/2012   HCT 30.7 (L) 11/27/2012   MCV 91.6 11/22/2012   PLT 330 11/22/2012      Component Value Date/Time   NA 133 (L) 11/29/2012 0430   K 4.3 11/29/2012 0430   CL 99 11/29/2012 0430   CO2 30 11/29/2012 0430   GLUCOSE 109 (H) 11/29/2012 0430   BUN 8 11/29/2012 0430   CREATININE 0.80 08/03/2019 0933   CALCIUM 9.1 11/29/2012 0430   PROT 6.2 03/23/2012 2010   ALBUMIN 3.4 (L) 03/23/2012 2010   AST 14 03/23/2012 2010   ALT 12 03/23/2012 2010   ALKPHOS 44 03/23/2012 2010   BILITOT 0.3 03/23/2012 2010   GFRNONAA 62 (L) 11/29/2012 0430   GFRAA 72 (L) 11/29/2012 0430   No results found for: CHOL, HDL, LDLCALC, LDLDIRECT, TRIG, CHOLHDL No results found for: YHAJ8R No results found for: VITAMINB12 Lab Results  Component Value Date   TSH 2.95 08/01/2020      07/31/2022   11:00 AM  Montreal Cognitive Assessment   Visuospatial/ Executive (0/5) 5  Naming (0/3) 3  Attention: Read list of digits (0/2) 1  Attention: Read list of letters (0/1) 1  Attention: Serial 7 subtraction starting at 100 (0/3) 3  Language: Repeat phrase (0/2) 2  Language : Fluency (0/1) 0  Abstraction (0/2) 2  Delayed Recall (0/5) 5  Orientation (0/6) 6  Total 28      ASSESSMENT AND PLAN  64 y.o. year old female  has a past medical history of Arthritis (R hip), Bursitis of left shoulder, Cancer (HCC), Chest pain, Fibromyalgia, Gastric ulcer, GERD (gastroesophageal reflux disease), Headache(784.0) (migraines), Hearing loss, Migraines, Rosacea, Tietze syndrome, and Vitamin D deficiency. here with   Migraine with aura and without status migrainosus, not intractable  Nadean reports having 16 migraine days a month. We will start Emgality  for prevention. She is aware to take two  injections for the loading dose then continue 1 injection every 30 days thereafter. She will continue rizatriptan  as needed. MRI unremarkable. MoCA 28/30 07/2022. She could consider formal neurocognitive testing if she wishes. I do not suspect neurodegenerative memory loss at this time. She may continue gabapentin  300mg  daily as needed for dysesthesias. Healthy lifestyle habits advised. I have encouraged her to follow-up closely with her primary care provider. Healthy lifestyle habits encouraged.  She will follow-up in 6 months, sooner if needed.  She verbalizes understanding and agreement with this plan.   I spent 30 minutes of face-to-face and non-face-to-face time with patient.  This included previsit chart review, lab review, study review, order entry, electronic health record documentation, patient education.    Greig Forbes, MSN, FNP-C 07/12/2024, 2:32 PM  [facility] [address] [place], [place] [address] [phone]  "

## 2025-01-09 ENCOUNTER — Ambulatory Visit: Admitting: Neurology
